# Patient Record
Sex: Female | Born: 1965
Health system: Southern US, Community
[De-identification: ages and names within clinical notes are randomized; demographics above are authoritative.]

## PROBLEM LIST (undated history)

## (undated) DIAGNOSIS — E119 Type 2 diabetes mellitus without complications: Secondary | ICD-10-CM

## (undated) DIAGNOSIS — Z87448 Personal history of other diseases of urinary system: Secondary | ICD-10-CM

## (undated) DIAGNOSIS — D069 Carcinoma in situ of cervix, unspecified: Secondary | ICD-10-CM

## (undated) DIAGNOSIS — Z973 Presence of spectacles and contact lenses: Secondary | ICD-10-CM

## (undated) DIAGNOSIS — I10 Essential (primary) hypertension: Secondary | ICD-10-CM

## (undated) DIAGNOSIS — N63 Unspecified lump in unspecified breast: Secondary | ICD-10-CM

## (undated) DIAGNOSIS — D259 Leiomyoma of uterus, unspecified: Secondary | ICD-10-CM

## (undated) DIAGNOSIS — E782 Mixed hyperlipidemia: Secondary | ICD-10-CM

## (undated) HISTORY — DX: Essential (primary) hypertension: I10

## (undated) HISTORY — DX: Unspecified lump in unspecified breast: N63.0

---
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
## 2008-09-02 ENCOUNTER — Emergency Department (HOSPITAL_BASED_OUTPATIENT_CLINIC_OR_DEPARTMENT_OTHER): Admission: EM | Admit: 2008-09-02 | Discharge: 2008-09-02 | Payer: Self-pay | Admitting: Emergency Medicine

## 2010-09-02 LAB — URINALYSIS, ROUTINE W REFLEX MICROSCOPIC
Glucose, UA: NEGATIVE mg/dL
Protein, ur: NEGATIVE mg/dL
Specific Gravity, Urine: 1.014 (ref 1.005–1.030)
Urobilinogen, UA: 0.2 mg/dL (ref 0.0–1.0)

## 2010-09-02 LAB — WET PREP, GENITAL: Yeast Wet Prep HPF POC: NONE SEEN

## 2010-09-02 LAB — URINE MICROSCOPIC-ADD ON

## 2019-02-07 ENCOUNTER — Other Ambulatory Visit: Payer: Self-pay | Admitting: Obstetrics and Gynecology

## 2019-02-07 DIAGNOSIS — N6320 Unspecified lump in the left breast, unspecified quadrant: Secondary | ICD-10-CM

## 2019-02-14 ENCOUNTER — Ambulatory Visit
Admission: RE | Admit: 2019-02-14 | Discharge: 2019-02-14 | Disposition: A | Discharge status: Home / Self Care | Payer: Commercial Managed Care - PPO | Source: Ambulatory Visit | Attending: Obstetrics and Gynecology | Admitting: Obstetrics and Gynecology

## 2019-02-14 ENCOUNTER — Encounter: Payer: Self-pay | Admitting: Obstetrics and Gynecology

## 2019-02-14 ENCOUNTER — Other Ambulatory Visit: Payer: Self-pay | Admitting: Obstetrics and Gynecology

## 2019-02-14 ENCOUNTER — Other Ambulatory Visit: Payer: Self-pay

## 2019-02-14 DIAGNOSIS — N6320 Unspecified lump in the left breast, unspecified quadrant: Secondary | ICD-10-CM

## 2019-02-14 DIAGNOSIS — R921 Mammographic calcification found on diagnostic imaging of breast: Secondary | ICD-10-CM

## 2019-02-28 ENCOUNTER — Telehealth: Payer: Self-pay | Admitting: *Deleted

## 2019-02-28 NOTE — Telephone Encounter (Signed)
Patient called and scheduled her new patient appt for 10/20. Patient given instructions for no visitors and address

## 2019-03-12 NOTE — Patient Instructions (Addendum)
Plan to have a conization of the cervix at the [facility] on April 02, 2019 with Dr. Driscilla Moats.  You will receive a phone call from the pre-surgical RN to discuss instructions and arrange for your COVID testing.   You will have COVID testing prior to this procedure. Please call for any questions or concerns. Office: [phone]  You can call the [facility] to inquire about the cost of your procedure. Phone: [phone].

## 2019-03-13 ENCOUNTER — Other Ambulatory Visit: Payer: Self-pay

## 2019-03-13 ENCOUNTER — Inpatient Hospital Stay: Payer: Commercial Managed Care - PPO | Attending: Gynecologic Oncology | Admitting: Gynecologic Oncology

## 2019-03-13 ENCOUNTER — Encounter: Payer: Self-pay | Admitting: Gynecologic Oncology

## 2019-03-13 VITALS — BP 140/80 | HR 97 | Temp 98.9°F | Resp 16 | Ht 64.0 in | Wt 147.2 lb

## 2019-03-13 DIAGNOSIS — N879 Dysplasia of cervix uteri, unspecified: Secondary | ICD-10-CM | POA: Insufficient documentation

## 2019-03-13 DIAGNOSIS — A630 Anogenital (venereal) warts: Secondary | ICD-10-CM | POA: Insufficient documentation

## 2019-03-13 DIAGNOSIS — R102 Pelvic and perineal pain: Secondary | ICD-10-CM | POA: Diagnosis not present

## 2019-03-13 DIAGNOSIS — I10 Essential (primary) hypertension: Secondary | ICD-10-CM | POA: Insufficient documentation

## 2019-03-13 DIAGNOSIS — Z7984 Long term (current) use of oral hypoglycemic drugs: Secondary | ICD-10-CM | POA: Insufficient documentation

## 2019-03-13 DIAGNOSIS — E119 Type 2 diabetes mellitus without complications: Secondary | ICD-10-CM | POA: Insufficient documentation

## 2019-03-13 DIAGNOSIS — D069 Carcinoma in situ of cervix, unspecified: Secondary | ICD-10-CM | POA: Insufficient documentation

## 2019-03-13 DIAGNOSIS — D259 Leiomyoma of uterus, unspecified: Secondary | ICD-10-CM | POA: Insufficient documentation

## 2019-03-13 DIAGNOSIS — Z79899 Other long term (current) drug therapy: Secondary | ICD-10-CM | POA: Insufficient documentation

## 2019-03-13 NOTE — Progress Notes (Addendum)
Consult Note: Gyn-Onc  Consult was requested by Dr. Wilhelmenia Blase for the evaluation of Ebony Holland 53 y.o. female  CC:  Chief Complaint  Patient presents with  . Cervical dysplasia    Assessment/Plan:  [name]  is a 53 y.o.  year old with CIN III and symptomatic uterine fibroids.  I discussed with the patient the nature of cervical and lower genital tract dysplasia.  We discussed that is due to HPV infection which is a function of the entire lower genital tract.  For this reason hysterectomy is not curative for cervical dysplasia.  She would require postoperative Paps of the vagina for at least 25 years after hysterectomy and carries an ongoing risk for vaginal or vulvar dysplasia requiring surgery, and potentially development of cancer.  I discussed the phenomenon of occult malignancy which can occur in the setting of a biopsy that showed CIN-2 to 3.  I explained this is due to underrepresentation all of the pathology in the biopsy specimen.  For this reason the gold standard recommendation is to proceed first with an excisional procedure such as a cone or LEEP prior to proceeding with hysterectomy.  I discussed that if malignancy is identified, she would require a radical hysterectomy rather than a extrafascial hysterectomy.  The patient asked why she could not go ahead with a radical hysterectomy regardless.  I discussed that the risks associated with radical hysterectomy particularly bladder dysfunction and fistula are as high as 10 to 30% and therefore the surgery should only be entertained in the setting of histologically confirmed malignancy.  I explained that after a cold knife conization of the cervix we could then consider proceeding with hysterectomy if she desired treatment for her symptomatic uterine fibroids.  I explained that hysterectomy would help with mass-effect symptoms and bleeding but would not likely alleviate her pelvic pain.  Based on my examination I feel that a  great deal of her pelvic pain is due to anxiety and pelvic floor dysfunction.  I do not feel that hysterectomy will resolve this and explained this to the patient.  She is interested in proceeding with hysterectomy.  I did explain to her that an option would be to continue to treat her abnormal uterine bleeding via hormonal methods until she transitioned into menopause at which time she might experience amenorrhea and improvement of symptoms.  She is leaning towards hysterectomy at this time.  We will proceed by first performing cold knife conization of the cervix in November.  After she is healed from the surgery at approximately 6 weeks later we will consider robotic assisted total hysterectomy for uterus greater than 250 g.  I discussed the cold knife conization with the patient, its risks, and anticipated postoperative recovery and symptoms.   HPI: [name] is a 53 year old P4 who was seen in consultation at the request of [place] for evaluation of CIN-3 and symptomatic uterine fibroids.  The patient reported having an abnormal Pap test during her work-up for abnormal uterine bleeding.  This was performed in February 06, 2019 and revealed H SIL with negative high-risk HPV.  Trichomonas was also present.  She reported that 3 years prior to this Pap test she also had an abnormal Pap test that did not require intervention.  Following her H SIL Pap she then proceeded to have a colposcopic evaluation of the cervix on February 15, 2019.  The colposcopic exam was unsatisfactory.  The entire transformation zone was not visualized.  There were acetowhite changes at  11:00.  Biopsies were taken from the ectocervix which revealed benign cervical mucosa and from the endocervix a curettage was performed which showed CIN-2 to 3.  With respect to her fibroid symptoms she reports menorrhagia menorrhagia for approximately 5 to 6 months.  She is never required a blood transfusion.  She reports  increasing pelvic pain and intolerance with pelvic examinations.  Ultrasound dimensions of the uterus on February 15, 2019 revealed a uterus measuring 11.15 x 8.46 x 4.71 cm with an endometrial thickness of 6.8 mm.  The left and right ovaries were all less than 3 cm and normal.  The largest fibroid measured 6.8 cm.  It was subserosal and extending off the fundus.  Otherwise her medical history is remarkable for hypertension and diabetes mellitus.  Her diabetes is type II.  She no longer takes insulin.  She reports that in October 2020 her hemoglobin A1c was 7.1.  She is overweight with a BMI of 25 but no diabetes.  She is a 1 private cesarean section and 3 prior vaginal births but denies other abdominal surgeries and has never had a myomectomy.  Current Meds:  Outpatient Encounter Medications as of 03/13/2019  Medication Sig  . glipiZIDE (GLUCOTROL) 5 MG tablet Take by mouth 2 (two) times daily.  . pioglitazone (ACTOS) 30 MG tablet Take 30 mg by mouth daily.   No facility-administered encounter medications on file as of 03/13/2019.     Allergy: No Known Allergies  Social Hx:   Social History   Socioeconomic History  . Marital status: Legally Separated    Spouse name: Not on file  . Number of children: Not on file  . Years of education: Not on file  . Highest education level: Not on file  Occupational History  . Not on file  Social Needs  . Financial resource strain: Not on file  . Food insecurity    Worry: Not on file    Inability: Not on file  . Transportation needs    Medical: Not on file    Non-medical: Not on file  Tobacco Use  . Smoking status: Former Research scientist (life sciences)  . Smokeless tobacco: Never Used  Substance and Sexual Activity  . Alcohol use: Not Currently  . Drug use: Not on file  . Sexual activity: Not on file  Lifestyle  . Physical activity    Days per week: Not on file    Minutes per session: Not on file  . Stress: Not on file  Relationships  . Social  Herbalist on phone: Not on file    Gets together: Not on file    Attends religious service: Not on file    Active member of club or organization: Not on file    Attends meetings of clubs or organizations: Not on file    Relationship status: Not on file  . Intimate partner violence    Fear of current or ex partner: Not on file    Emotionally abused: Not on file    Physically abused: Not on file    Forced sexual activity: Not on file  Other Topics Concern  . Not on file  Social History Narrative  . Not on file    Past Surgical Hx:  Past Surgical History:  Procedure Laterality Date  . CESAREAN SECTION N/A 05/25/1991  . TUBAL LIGATION Bilateral     Past Medical Hx:  Past Medical History:  Diagnosis Date  . Breast lump    left  . Diabetes  mellitus without complication ([place])   . Hypertension     Past Gynecological History:  No bleeding No LMP recorded.  Family Hx:  Family History  Problem Relation Age of Onset  . Breast cancer Mother     Review of Systems:  Constitutional  Feels well,    ENT Normal appearing ears and nares bilaterally Skin/Breast  No rash, sores, jaundice, itching, dryness Cardiovascular  No chest pain, shortness of breath, or edema  Pulmonary  No cough or wheeze.  Gastro Intestinal  No nausea, vomitting, or diarrhoea. No bright red blood per rectum, no abdominal pain, change in bowel movement, or constipation.  Genito Urinary  No frequency, urgency, dysuria, + abnormal vaginal bleeding, + pelvic pains Musculo Skeletal  No myalgia, arthralgia, joint swelling or pain  Neurologic  No weakness, numbness, change in gait,  Psychology  No depression, anxiety, insomnia.   Vitals:  Blood pressure 140/80, pulse 97, temperature 98.9 F (37.2 C), temperature source Temporal, resp. rate 16, height 5\' 4"  (1.626 m), weight 147 lb 4 oz (66.8 kg), SpO2 95 %.  Physical Exam: WD in NAD Neck  Supple NROM, without any enlargements.  Lymph Node  Survey No cervical supraclavicular or inguinal adenopathy Cardiovascular  Pulse normal rate, regularity and rhythm. S1 and S2 normal.  Lungs  Clear to auscultation bilateraly, without wheezes/crackles/rhonchi. Good air movement.  Skin  No rash/lesions/breakdown  Psychiatry  Alert and oriented to person, place, and time  Abdomen  Normoactive bowel sounds, abdomen soft, non-tender and nonobese without evidence of hernia. Back No CVA tenderness Genito Urinary  Vulva/vagina: Normal external female genitalia.  No lesions. No discharge or bleeding.  Bladder/urethra:  No lesions or masses, well supported bladder  Vagina: normal in appearance, tender at introitus and pelvic floor  Cervix: Normal appearing, no lesions. Palpably normal. 5cm in diameter.  Uterus: Bulky, 14+ cm, mobile, no parametrial involvement or nodularity. Exam limited secondary to patient intolerance to exam.  Adnexa: no palpable masses. Rectal  deferred Extremities  No bilateral cyanosis, clubbing or edema.   Thereasa Solo, MD  03/13/2019, 1:04 PM

## 2019-03-13 NOTE — H&P (View-Only) (Signed)
Consult Note: Gyn-Onc  Consult was requested by Dr. Wilhelmenia Blase for the evaluation of Ebony Holland 53 y.o. female  CC:  Chief Complaint  Patient presents with  . Cervical dysplasia    Assessment/Plan:  Ebony Holland  is a 53 y.o.  year old with CIN III and symptomatic uterine fibroids.  I discussed with the patient the nature of cervical and lower genital tract dysplasia.  We discussed that is due to HPV infection which is a function of the entire lower genital tract.  For this reason hysterectomy is not curative for cervical dysplasia.  She would require postoperative Paps of the vagina for at least 25 years after hysterectomy and carries an ongoing risk for vaginal or vulvar dysplasia requiring surgery, and potentially development of cancer.  I discussed the phenomenon of occult malignancy which can occur in the setting of a biopsy that showed CIN-2 to 3.  I explained this is due to underrepresentation all of the pathology in the biopsy specimen.  For this reason the gold standard recommendation is to proceed first with an excisional procedure such as a cone or LEEP prior to proceeding with hysterectomy.  I discussed that if malignancy is identified, she would require a radical hysterectomy rather than a extrafascial hysterectomy.  The patient asked why she could not go ahead with a radical hysterectomy regardless.  I discussed that the risks associated with radical hysterectomy particularly bladder dysfunction and fistula are as high as 10 to 30% and therefore the surgery should only be entertained in the setting of histologically confirmed malignancy.  I explained that after a cold knife conization of the cervix we could then consider proceeding with hysterectomy if she desired treatment for her symptomatic uterine fibroids.  I explained that hysterectomy would help with mass-effect symptoms and bleeding but would not likely alleviate her pelvic pain.  Based on my examination I feel that a  great deal of her pelvic pain is due to anxiety and pelvic floor dysfunction.  I do not feel that hysterectomy will resolve this and explained this to the patient.  She is interested in proceeding with hysterectomy.  I did explain to her that an option would be to continue to treat her abnormal uterine bleeding via hormonal methods until she transitioned into menopause at which time she might experience amenorrhea and improvement of symptoms.  She is leaning towards hysterectomy at this time.  We will proceed by first performing cold knife conization of the cervix in November.  After she is healed from the surgery at approximately 6 weeks later we will consider robotic assisted total hysterectomy for uterus greater than 250 g.  I discussed the cold knife conization with the patient, its risks, and anticipated postoperative recovery and symptoms.   HPI: Ebony Holland is a 53 year old P4 who was seen in consultation at the request of [place] for evaluation of CIN-3 and symptomatic uterine fibroids.  The patient reported having an abnormal Pap test during her work-up for abnormal uterine bleeding.  This was performed in February 06, 2019 and revealed H SIL with negative high-risk HPV.  Trichomonas was also present.  She reported that 3 years prior to this Pap test she also had an abnormal Pap test that did not require intervention.  Following her H SIL Pap she then proceeded to have a colposcopic evaluation of the cervix on February 15, 2019.  The colposcopic exam was unsatisfactory.  The entire transformation zone was not visualized.  There were acetowhite changes at  11:00.  Biopsies were taken from the ectocervix which revealed benign cervical mucosa and from the endocervix a curettage was performed which showed CIN-2 to 3.  With respect to her fibroid symptoms she reports menorrhagia menorrhagia for approximately 5 to 6 months.  She is never required a blood transfusion.  She reports  increasing pelvic pain and intolerance with pelvic examinations.  Ultrasound dimensions of the uterus on February 15, 2019 revealed a uterus measuring 11.15 x 8.46 x 4.71 cm with an endometrial thickness of 6.8 mm.  The left and right ovaries were all less than 3 cm and normal.  The largest fibroid measured 6.8 cm.  It was subserosal and extending off the fundus.  Otherwise her medical history is remarkable for hypertension and diabetes mellitus.  Her diabetes is type II.  She no longer takes insulin.  She reports that in October 2020 her hemoglobin A1c was 7.1.  She is overweight with a BMI of 25 but no diabetes.  She is a 1 private cesarean section and 3 prior vaginal births but denies other abdominal surgeries and has never had a myomectomy.  Current Meds:  Outpatient Encounter Medications as of 03/13/2019  Medication Sig  . glipiZIDE (GLUCOTROL) 5 MG tablet Take by mouth 2 (two) times daily.  . pioglitazone (ACTOS) 30 MG tablet Take 30 mg by mouth daily.   No facility-administered encounter medications on file as of 03/13/2019.     Allergy: No Known Allergies  Social Hx:   Social History   Socioeconomic History  . Marital status: Legally Separated    Spouse name: Not on file  . Number of children: Not on file  . Years of education: Not on file  . Highest education level: Not on file  Occupational History  . Not on file  Social Needs  . Financial resource strain: Not on file  . Food insecurity    Worry: Not on file    Inability: Not on file  . Transportation needs    Medical: Not on file    Non-medical: Not on file  Tobacco Use  . Smoking status: Former Research scientist (life sciences)  . Smokeless tobacco: Never Used  Substance and Sexual Activity  . Alcohol use: Not Currently  . Drug use: Not on file  . Sexual activity: Not on file  Lifestyle  . Physical activity    Days per week: Not on file    Minutes per session: Not on file  . Stress: Not on file  Relationships  . Social  Herbalist on phone: Not on file    Gets together: Not on file    Attends religious service: Not on file    Active member of club or organization: Not on file    Attends meetings of clubs or organizations: Not on file    Relationship status: Not on file  . Intimate partner violence    Fear of current or ex partner: Not on file    Emotionally abused: Not on file    Physically abused: Not on file    Forced sexual activity: Not on file  Other Topics Concern  . Not on file  Social History Narrative  . Not on file    Past Surgical Hx:  Past Surgical History:  Procedure Laterality Date  . CESAREAN SECTION N/A 05/25/1991  . TUBAL LIGATION Bilateral     Past Medical Hx:  Past Medical History:  Diagnosis Date  . Breast lump    left  . Diabetes  mellitus without complication ([place])   . Hypertension     Past Gynecological History:  No bleeding No LMP recorded.  Family Hx:  Family History  Problem Relation Age of Onset  . Breast cancer Mother     Review of Systems:  Constitutional  Feels well,    ENT Normal appearing ears and nares bilaterally Skin/Breast  No rash, sores, jaundice, itching, dryness Cardiovascular  No chest pain, shortness of breath, or edema  Pulmonary  No cough or wheeze.  Gastro Intestinal  No nausea, vomitting, or diarrhoea. No bright red blood per rectum, no abdominal pain, change in bowel movement, or constipation.  Genito Urinary  No frequency, urgency, dysuria, + abnormal vaginal bleeding, + pelvic pains Musculo Skeletal  No myalgia, arthralgia, joint swelling or pain  Neurologic  No weakness, numbness, change in gait,  Psychology  No depression, anxiety, insomnia.   Vitals:  Blood pressure 140/80, pulse 97, temperature 98.9 F (37.2 C), temperature source Temporal, resp. rate 16, height 5\' 4"  (1.626 m), weight 147 lb 4 oz (66.8 kg), SpO2 95 %.  Physical Exam: WD in NAD Neck  Supple NROM, without any enlargements.  Lymph Node  Survey No cervical supraclavicular or inguinal adenopathy Cardiovascular  Pulse normal rate, regularity and rhythm. S1 and S2 normal.  Lungs  Clear to auscultation bilateraly, without wheezes/crackles/rhonchi. Good air movement.  Skin  No rash/lesions/breakdown  Psychiatry  Alert and oriented to person, place, and time  Abdomen  Normoactive bowel sounds, abdomen soft, non-tender and nonobese without evidence of hernia. Back No CVA tenderness Genito Urinary  Vulva/vagina: Normal external female genitalia.  No lesions. No discharge or bleeding.  Bladder/urethra:  No lesions or masses, well supported bladder  Vagina: normal in appearance, tender at introitus and pelvic floor  Cervix: Normal appearing, no lesions. Palpably normal. 5cm in diameter.  Uterus: Bulky, 14+ cm, mobile, no parametrial involvement or nodularity. Exam limited secondary to patient intolerance to exam.  Adnexa: no palpable masses. Rectal  deferred Extremities  No bilateral cyanosis, clubbing or edema.   Thereasa Solo, MD  03/13/2019, 1:04 PM

## 2019-03-23 ENCOUNTER — Encounter (HOSPITAL_COMMUNITY): Payer: Self-pay | Admitting: *Deleted

## 2019-03-23 ENCOUNTER — Other Ambulatory Visit: Payer: Self-pay

## 2019-03-23 ENCOUNTER — Emergency Department (HOSPITAL_COMMUNITY): Payer: Commercial Managed Care - PPO

## 2019-03-23 ENCOUNTER — Inpatient Hospital Stay (HOSPITAL_COMMUNITY)
Admission: EM | Admit: 2019-03-23 | Discharge: 2019-03-25 | DRG: 690 | Disposition: A | Discharge status: Home / Self Care | Payer: Commercial Managed Care - PPO | Attending: Internal Medicine | Admitting: Internal Medicine

## 2019-03-23 ENCOUNTER — Telehealth: Payer: Self-pay | Admitting: *Deleted

## 2019-03-23 DIAGNOSIS — B9620 Unspecified Escherichia coli [E. coli] as the cause of diseases classified elsewhere: Secondary | ICD-10-CM | POA: Diagnosis present

## 2019-03-23 DIAGNOSIS — N10 Acute tubulo-interstitial nephritis: Principal | ICD-10-CM | POA: Diagnosis present

## 2019-03-23 DIAGNOSIS — E1169 Type 2 diabetes mellitus with other specified complication: Secondary | ICD-10-CM

## 2019-03-23 DIAGNOSIS — E119 Type 2 diabetes mellitus without complications: Secondary | ICD-10-CM | POA: Diagnosis present

## 2019-03-23 DIAGNOSIS — I10 Essential (primary) hypertension: Secondary | ICD-10-CM | POA: Diagnosis not present

## 2019-03-23 DIAGNOSIS — E871 Hypo-osmolality and hyponatremia: Secondary | ICD-10-CM

## 2019-03-23 DIAGNOSIS — Z20828 Contact with and (suspected) exposure to other viral communicable diseases: Secondary | ICD-10-CM | POA: Diagnosis present

## 2019-03-23 DIAGNOSIS — D509 Iron deficiency anemia, unspecified: Secondary | ICD-10-CM

## 2019-03-23 DIAGNOSIS — Z803 Family history of malignant neoplasm of breast: Secondary | ICD-10-CM

## 2019-03-23 DIAGNOSIS — E860 Dehydration: Secondary | ICD-10-CM | POA: Diagnosis present

## 2019-03-23 DIAGNOSIS — N938 Other specified abnormal uterine and vaginal bleeding: Secondary | ICD-10-CM | POA: Diagnosis present

## 2019-03-23 DIAGNOSIS — D500 Iron deficiency anemia secondary to blood loss (chronic): Secondary | ICD-10-CM | POA: Diagnosis present

## 2019-03-23 DIAGNOSIS — D259 Leiomyoma of uterus, unspecified: Secondary | ICD-10-CM

## 2019-03-23 DIAGNOSIS — Z79899 Other long term (current) drug therapy: Secondary | ICD-10-CM

## 2019-03-23 DIAGNOSIS — Z7984 Long term (current) use of oral hypoglycemic drugs: Secondary | ICD-10-CM

## 2019-03-23 DIAGNOSIS — N12 Tubulo-interstitial nephritis, not specified as acute or chronic: Secondary | ICD-10-CM | POA: Diagnosis not present

## 2019-03-23 DIAGNOSIS — Z87891 Personal history of nicotine dependence: Secondary | ICD-10-CM

## 2019-03-23 DIAGNOSIS — E782 Mixed hyperlipidemia: Secondary | ICD-10-CM | POA: Diagnosis present

## 2019-03-23 DIAGNOSIS — E878 Other disorders of electrolyte and fluid balance, not elsewhere classified: Secondary | ICD-10-CM | POA: Diagnosis present

## 2019-03-23 DIAGNOSIS — Z86001 Personal history of in-situ neoplasm of cervix uteri: Secondary | ICD-10-CM

## 2019-03-23 LAB — CBC
HCT: 40.1 % (ref 36.0–46.0)
Hemoglobin: 12.4 g/dL (ref 12.0–15.0)
MCH: 22.7 pg — ABNORMAL LOW (ref 26.0–34.0)
MCHC: 30.9 g/dL (ref 30.0–36.0)
MCV: 73.4 fL — ABNORMAL LOW (ref 80.0–100.0)
Platelets: 248 10*3/uL (ref 150–400)
RBC: 5.46 MIL/uL — ABNORMAL HIGH (ref 3.87–5.11)
RDW: 20.5 % — ABNORMAL HIGH (ref 11.5–15.5)
WBC: 13.9 10*3/uL — ABNORMAL HIGH (ref 4.0–10.5)
nRBC: 0 % (ref 0.0–0.2)

## 2019-03-23 LAB — COMPREHENSIVE METABOLIC PANEL
ALT: 14 U/L (ref 0–44)
AST: 23 U/L (ref 15–41)
Albumin: 4.6 g/dL (ref 3.5–5.0)
Alkaline Phosphatase: 60 U/L (ref 38–126)
Anion gap: 14 (ref 5–15)
BUN: 13 mg/dL (ref 6–20)
CO2: 23 mmol/L (ref 22–32)
Calcium: 9.9 mg/dL (ref 8.9–10.3)
Chloride: 96 mmol/L — ABNORMAL LOW (ref 98–111)
Creatinine, Ser: 0.91 mg/dL (ref 0.44–1.00)
GFR calc Af Amer: >60 mL/min (ref >60)
GFR calc non Af Amer: >60 mL/min (ref >60)
Glucose, Bld: 246 mg/dL — ABNORMAL HIGH (ref 70–99)
Potassium: 4.1 mmol/L (ref 3.5–5.1)
Sodium: 133 mmol/L — ABNORMAL LOW (ref 135–145)
Total Bilirubin: 1.1 mg/dL (ref 0.3–1.2)
Total Protein: 9.7 g/dL — ABNORMAL HIGH (ref 6.5–8.1)

## 2019-03-23 LAB — URINALYSIS, ROUTINE W REFLEX MICROSCOPIC
Bilirubin Urine: NEGATIVE
Glucose, UA: >=500 mg/dL — AB
Ketones, ur: 80 mg/dL — AB
Nitrite: NEGATIVE
Protein, ur: >=300 mg/dL — AB
Specific Gravity, Urine: 1.013 (ref 1.005–1.030)
WBC, UA: >50 WBC/hpf — ABNORMAL HIGH (ref 0–5)
pH: 5 (ref 5.0–8.0)

## 2019-03-23 LAB — POC URINE PREG, ED: Preg Test, Ur: NEGATIVE

## 2019-03-23 LAB — CBG MONITORING, ED: Glucose-Capillary: 203 mg/dL — ABNORMAL HIGH (ref 70–99)

## 2019-03-23 LAB — LIPASE, BLOOD: Lipase: 24 U/L (ref 11–51)

## 2019-03-23 LAB — HCG, QUANTITATIVE, PREGNANCY: hCG, Beta Chain, Quant, S: <1 m[IU]/mL (ref <5)

## 2019-03-23 MED ORDER — TRAMADOL HCL 50 MG PO TABS
50.0000 mg | ORAL_TABLET | Freq: Four times a day (QID) | ORAL | Status: DC | PRN
  Administered 2019-03-24: 50 mg via ORAL
  Filled 2019-03-23: qty 1

## 2019-03-23 MED ORDER — INSULIN ASPART 100 UNIT/ML ~~LOC~~ SOLN
0.0000 [IU] | Freq: Three times a day (TID) | SUBCUTANEOUS | Status: DC
  Administered 2019-03-24 (×2): 3 [IU] via SUBCUTANEOUS
  Administered 2019-03-25: 2 [IU] via SUBCUTANEOUS
  Filled 2019-03-23: qty 0.09

## 2019-03-23 MED ORDER — ACETAMINOPHEN 325 MG PO TABS: 650.0000 mg | ORAL_TABLET | Freq: Four times a day (QID) | ORAL | Status: DC | PRN

## 2019-03-23 MED ORDER — LISINOPRIL 20 MG PO TABS
40.0000 mg | ORAL_TABLET | Freq: Every day | ORAL | Status: DC
  Administered 2019-03-24 – 2019-03-25 (×2): 40 mg via ORAL
  Filled 2019-03-23 (×2): qty 2

## 2019-03-23 MED ORDER — IOHEXOL 300 MG/ML  SOLN
100.0000 mL | Freq: Once | INTRAMUSCULAR | Status: AC | PRN
  Administered 2019-03-23: 100 mL via INTRAVENOUS

## 2019-03-23 MED ORDER — SODIUM CHLORIDE 0.9% FLUSH
3.0000 mL | Freq: Once | INTRAVENOUS | Status: AC
  Administered 2019-03-23: 3 mL via INTRAVENOUS

## 2019-03-23 MED ORDER — KETOROLAC TROMETHAMINE 30 MG/ML IJ SOLN: 30.0000 mg | Freq: Four times a day (QID) | INTRAMUSCULAR | Status: DC | PRN

## 2019-03-23 MED ORDER — SODIUM CHLORIDE 0.9 % IV SOLN
1.0000 g | Freq: Once | INTRAVENOUS | Status: AC
  Administered 2019-03-23: 1 g via INTRAVENOUS
  Filled 2019-03-23: qty 10

## 2019-03-23 MED ORDER — SODIUM CHLORIDE 0.9 % IV BOLUS
500.0000 mL | Freq: Once | INTRAVENOUS | Status: AC
  Administered 2019-03-23: 500 mL via INTRAVENOUS

## 2019-03-23 MED ORDER — ONDANSETRON HCL 4 MG PO TABS: 4.0000 mg | ORAL_TABLET | Freq: Four times a day (QID) | ORAL | Status: DC | PRN

## 2019-03-23 MED ORDER — SODIUM CHLORIDE 0.9 % IV SOLN
1.0000 g | INTRAVENOUS | Status: DC
  Administered 2019-03-24: 1 g via INTRAVENOUS
  Filled 2019-03-23: qty 1
  Filled 2019-03-23: qty 10

## 2019-03-23 MED ORDER — ACETAMINOPHEN 650 MG RE SUPP: 650.0000 mg | Freq: Four times a day (QID) | RECTAL | Status: DC | PRN

## 2019-03-23 MED ORDER — SODIUM CHLORIDE (PF) 0.9 % IJ SOLN
INTRAMUSCULAR | Status: AC
  Filled 2019-03-23: qty 50

## 2019-03-23 MED ORDER — SODIUM CHLORIDE 0.9 % IV SOLN
INTRAVENOUS | Status: DC
  Administered 2019-03-23 – 2019-03-25 (×4): via INTRAVENOUS

## 2019-03-23 MED ORDER — HYDRALAZINE HCL 20 MG/ML IJ SOLN: 5.0000 mg | INTRAMUSCULAR | Status: DC | PRN

## 2019-03-23 MED ORDER — ONDANSETRON HCL 4 MG/2ML IJ SOLN: 4.0000 mg | Freq: Four times a day (QID) | INTRAMUSCULAR | Status: DC | PRN

## 2019-03-23 MED ORDER — INSULIN ASPART 100 UNIT/ML ~~LOC~~ SOLN
3.0000 [IU] | Freq: Once | SUBCUTANEOUS | Status: AC
  Administered 2019-03-23: 3 [IU] via SUBCUTANEOUS
  Filled 2019-03-23: qty 0.03

## 2019-03-23 MED ORDER — FENTANYL CITRATE (PF) 100 MCG/2ML IJ SOLN
50.0000 ug | Freq: Once | INTRAMUSCULAR | Status: AC
  Administered 2019-03-23: 50 ug via INTRAVENOUS
  Filled 2019-03-23: qty 2

## 2019-03-23 MED ORDER — ONDANSETRON HCL 4 MG/2ML IJ SOLN
4.0000 mg | Freq: Once | INTRAMUSCULAR | Status: AC
  Administered 2019-03-23: 4 mg via INTRAVENOUS
  Filled 2019-03-23: qty 2

## 2019-03-23 NOTE — ED Provider Notes (Signed)
[facility] DEPT Provider Note   CSN: [id number] Arrival date & time: 03/23/19  1455     History   Chief Complaint Chief Complaint  Patient presents with   Abdominal Pain   Emesis    HPI Ebony Holland is a 53 y.o. female.     Patient is a 53 year old female who presents with abdominal pain.  She states she has a history of uterine fibroids and recently had an abnormal Pap smear.  She was diagnosed with CIN-3.  She is scheduled to have surgery on November 9 to remove the fibroids and some kind of surgery regarding the abnormal Pap smear.  She does not typically have pain with her fibroids other than some cramping and heavy menstrual cycles.  She states over the last week she has had worsening pain throughout her abdomen.  Its worse on the left side and towards the lower abdomen but its radiates all through her abdomen.  She had associated nausea and vomiting.  No fevers.  No change in bowels.  No urinary symptoms.  No history of prior abdominal surgeries.     Past Medical History:  Diagnosis Date   Breast lump    left   Diabetes mellitus without complication ([place])    Hypertension     Patient Active Problem List   Diagnosis Date Noted   Pyelonephritis 03/23/2019   CIN III (cervical intraepithelial neoplasia III) 03/13/2019    Past Surgical History:  Procedure Laterality Date   CESAREAN SECTION N/A 05/25/1991   TUBAL LIGATION Bilateral      OB History   No obstetric history on file.      Home Medications    Prior to Admission medications   Medication Sig Start Date End Date Taking? Authorizing Provider  cholecalciferol (VITAMIN D3) 25 MCG (1000 UT) tablet Take 1,000 Units by mouth daily.   Yes [provider]  glipiZIDE (GLUCOTROL) 5 MG tablet Take by mouth 2 (two) times daily.   Yes [provider]  ibuprofen (ADVIL) 600 MG tablet Take 600 mg by mouth every 6 (six) hours as needed.   Yes [provider]  lisinopril (ZESTRIL) 40 MG tablet Take 40 mg by mouth daily. 12/24/15  Yes [provider]  Multiple Vitamin (MULTIVITAMIN) capsule Take 1 capsule by mouth daily.   Yes [provider]  pioglitazone (ACTOS) 30 MG tablet Take 30 mg by mouth daily.   Yes [provider]    Family History Family History  Problem Relation Age of Onset   Breast cancer Mother     Social History Social History   Tobacco Use   Smoking status: Former Smoker   Smokeless tobacco: Never Used  Substance Use Topics   Alcohol use: Not Currently   Drug use: Not on file     Allergies   Patient has no known allergies.   Review of Systems Review of Systems  Constitutional: Negative for chills, diaphoresis, fatigue and fever.  HENT: Negative for congestion, rhinorrhea and sneezing.   Eyes: Negative.   Respiratory: Negative for cough, chest tightness and shortness of breath.   Cardiovascular: Negative for chest pain and leg swelling.  Gastrointestinal: Positive for abdominal pain, nausea and vomiting. Negative for blood in stool and diarrhea.  Genitourinary: Negative for difficulty urinating, flank pain, frequency and hematuria.  Musculoskeletal: Negative for arthralgias and back pain.  Skin: Negative for rash.  Neurological: Negative for dizziness, speech difficulty, weakness, numbness and headaches.     Physical  Exam Updated Vital Signs BP 135/76    Pulse (!) 101    Temp 98.7 F (37.1 C) (Oral)    Resp 16    LMP 03/06/2019    SpO2 100%   Physical Exam Constitutional:      Appearance: She is well-developed.  HENT:     Head: Normocephalic and atraumatic.  Eyes:     Pupils: Pupils are equal, round, and reactive to light.  Neck:     Musculoskeletal: Normal range of motion and neck supple.  Cardiovascular:     Rate and Rhythm: Normal rate and regular rhythm.     Heart sounds: Normal heart sounds.  Pulmonary:     Effort: Pulmonary effort is normal. No  respiratory distress.     Breath sounds: Normal breath sounds. No wheezing or rales.  Chest:     Chest wall: No tenderness.  Abdominal:     General: Bowel sounds are normal.     Palpations: Abdomen is soft.     Tenderness: There is generalized abdominal tenderness. There is no guarding or rebound.  Musculoskeletal: Normal range of motion.  Lymphadenopathy:     Cervical: No cervical adenopathy.  Skin:    General: Skin is warm and dry.     Findings: No rash.  Neurological:     Mental Status: She is alert and oriented to person, place, and time.      ED Treatments / Results  Labs (all labs ordered are listed, but only abnormal results are displayed) Labs Reviewed  COMPREHENSIVE METABOLIC PANEL - Abnormal; Notable for the following components:      Result Value   Sodium 133 (*)    Chloride 96 (*)    Glucose, Bld 246 (*)    Total Protein 9.7 (*)    All other components within normal limits  CBC - Abnormal; Notable for the following components:   WBC 13.9 (*)    RBC 5.46 (*)    MCV 73.4 (*)    MCH 22.7 (*)    RDW 20.5 (*)    All other components within normal limits  URINALYSIS, ROUTINE W REFLEX MICROSCOPIC - Abnormal; Notable for the following components:   APPearance HAZY (*)    Glucose, UA >=500 (*)    Hgb urine dipstick SMALL (*)    Ketones, ur 80 (*)    Protein, ur >=300 (*)    Leukocytes,Ua SMALL (*)    WBC, UA >50 (*)    Bacteria, UA RARE (*)    All other components within normal limits  URINE CULTURE  SARS CORONAVIRUS 2 (TAT 6-24 HRS)  LIPASE, BLOOD  HCG, QUANTITATIVE, PREGNANCY  POC URINE PREG, ED    EKG None  Radiology Ct Abdomen Pelvis W Contrast  Result Date: 03/23/2019 CLINICAL DATA:  Abdominal pain for a week. EXAM: CT ABDOMEN AND PELVIS WITH CONTRAST TECHNIQUE: Multidetector CT imaging of the abdomen and pelvis was performed using the standard protocol following bolus administration of intravenous contrast. CONTRAST:  141mL OMNIPAQUE IOHEXOL 300  MG/ML  SOLN COMPARISON:  None. FINDINGS: Lower chest: No acute abnormality. Hepatobiliary: No focal liver abnormality is seen. No gallstones, gallbladder wall thickening, or biliary dilatation. Pancreas: Unremarkable. No pancreatic ductal dilatation or surrounding inflammatory changes. Spleen: Low-attenuation regions in the spleen are nonspecific but may represent cysts or hemangiomas. Adrenals/Urinary Tract: Adrenal glands are normal. There is urothelial enhancement in the left renal pelvis and proximal left ureter. There is a small amount of adjacent fat stranding and a small amount  of perinephric fluid in this region. The remainder of the left ureter is normal with no stones. No evidence of pyelonephritis. The kidneys are otherwise normal. The right ureter is normal. The bladder is unremarkable. Adrenal glands are normal. Stomach/Bowel: The stomach and small bowel are unremarkable. The colon and appendix are normal. Vascular/Lymphatic: No significant vascular findings are present. No enlarged abdominal or pelvic lymph nodes. Reproductive: There is a large fibroid in the uterus. Other: No abdominal wall hernia or abnormality. No abdominopelvic ascites. Musculoskeletal: There is a sclerotic lesion within L2 which is ill-defined and nonspecific. A sclerotic focus is seen at L1 as well. IMPRESSION: 1. There is urothelial enhancement in the left renal pelvis and proximal ureter with a small amount of adjacent fat stranding and perinephric fluid. These findings are likely infectious. No associated parenchymal changes are seen in the left kidney. Recommend correlation with history, physical exam, and urinalysis. 2. Sclerotic lesion in L2. Sclerotic focus at L1. These findings are nonspecific. If the patient has outside imaging, recommend comparison. Otherwise, recommend a bone scan for further evaluation. 3. Large fibroid in the uterus. 4. Low-attenuation lesions in the spleen are nonspecific but of doubtful acute  significance. Electronically Signed   By: Dorise Bullion III M.D   On: 03/23/2019 20:42    Procedures Procedures (including critical care time)  Medications Ordered in ED Medications  sodium chloride (PF) 0.9 % injection (has no administration in time range)  cefTRIAXone (ROCEPHIN) 1 g in sodium chloride 0.9 % 100 mL IVPB (has no administration in time range)  sodium chloride 0.9 % bolus 500 mL (has no administration in time range)  sodium chloride flush (NS) 0.9 % injection 3 mL (3 mLs Intravenous Given 03/23/19 1837)  fentaNYL (SUBLIMAZE) injection 50 mcg (50 mcg Intravenous Given 03/23/19 1823)  sodium chloride 0.9 % bolus 500 mL (0 mLs Intravenous Stopped 03/23/19 1923)  ondansetron (ZOFRAN) injection 4 mg (4 mg Intravenous Given 03/23/19 1822)  iohexol (OMNIPAQUE) 300 MG/ML solution 100 mL (100 mLs Intravenous Contrast Given 03/23/19 2006)     Initial Impression / Assessment and Plan / ED Course  I have reviewed the triage vital signs and the nursing notes.  Pertinent labs & imaging results that were available during my care of the patient were reviewed by me and considered in my medical decision making (see chart for details).        Patient is a 53 year old female who presents with left-sided abdominal pain with some decreased urination and nausea and vomiting.  Her CT scan shows evidence of probable pyelonephritis.  Her urine has some signs of infection.  Her labs show an elevated white count.  She also has an elevated glucose.  There is no suggestions of DKA.  She was given IV fluids and IV antibiotics..  She is mildly tachycardic with a heart rate in the low 100s.  Her blood pressure stable.  I spoke with Dr. Barbaraann Faster who will admit the patient for further treatment.  Final Clinical Impressions(s) / ED Diagnoses   Final diagnoses:  Pyelonephritis    ED Discharge Orders    None       Malvin Johns, MD 03/23/19 2135

## 2019-03-23 NOTE — ED Notes (Signed)
Pt ambulatory to and from bathroom with a steady gait. No assistance needed.  

## 2019-03-23 NOTE — Telephone Encounter (Signed)
Patient's daughter called and stated "my mom is at a level 10 pain level, she wants to know if we should go to the ER and switch one." Per Melissa APP patient to go the [facility] ER, daughter verbalized understanding

## 2019-03-23 NOTE — ED Notes (Signed)
ED TO INPATIENT HANDOFF REPORT  Name/Age/Gender Ebony Holland 53 y.o. female  Code Status    Code Status Orders  (From admission, onward)         Start     Ordered   03/23/19 2157  Full code  Continuous     03/23/19 2201        Code Status History    This patient has a current code status but no historical code status.   Advance Care Planning Activity      Home/SNF/Other Home  Chief Complaint abd pain; emesis  Level of Care/Admitting Diagnosis ED Disposition    ED Disposition Condition Comment   Admit  Hospital Area: [facility] [id number]  Level of Care: Med-Surg [16]  Covid Evaluation: Asymptomatic Screening Protocol (No Symptoms)  Diagnosis: Pyelonephritis [id number]  Admitting Physician: Waldemar Dickens [id number]  Attending Physician: MERRELL, DAVID J [id number]  PT Class (Do Not Modify): Observation [104]  PT Acc Code (Do Not Modify): Observation [10022]       Medical History Past Medical History:  Diagnosis Date  . Breast lump    left  . Diabetes mellitus without complication ([place])   . Hypertension     Allergies No Known Allergies  IV Location/Drains/Wounds Patient Lines/Drains/Airways Status   Active Line/Drains/Airways    Name:   Placement date:   Placement time:   Site:   Days:   Peripheral IV 03/23/19 Left Antecubital   03/23/19    1821    Antecubital   less than 1          Labs/Imaging Results for orders placed or performed during the hospital encounter of 03/23/19 (from the past 48 hour(s))  Lipase, blood     Status: None   Collection Time: 03/23/19  5:45 PM  Result Value Ref Range   Lipase 24 11 - 51 U/L    Comment: Performed at [facility], [place] [address] [place], [place] [address]  Comprehensive metabolic panel     Status: Abnormal   Collection Time: 03/23/19  5:45 PM  Result Value Ref Range   Sodium 133 (L) 135 - 145 mmol/L   Potassium 4.1 3.5 - 5.1 mmol/L   Chloride 96 (L) 98 - 111 mmol/L    CO2 23 22 - 32 mmol/L   Glucose, Bld 246 (H) 70 - 99 mg/dL   BUN 13 6 - 20 mg/dL   Creatinine, Ser 0.91 0.44 - 1.00 mg/dL   Calcium 9.9 8.9 - 10.3 mg/dL   Total Protein 9.7 (H) 6.5 - 8.1 g/dL   Albumin 4.6 3.5 - 5.0 g/dL   AST 23 15 - 41 U/L   ALT 14 0 - 44 U/L   Alkaline Phosphatase 60 38 - 126 U/L   Total Bilirubin 1.1 0.3 - 1.2 mg/dL   GFR calc non Af Amer >60 >60 mL/min   GFR calc Af Amer >60 >60 mL/min   Anion gap 14 5 - 15    Comment: Performed at [facility], [place] [address] [place], [place] [address]  CBC     Status: Abnormal   Collection Time: 03/23/19  5:45 PM  Result Value Ref Range   WBC 13.9 (H) 4.0 - 10.5 K/uL   RBC 5.46 (H) 3.87 - 5.11 MIL/uL   Hemoglobin 12.4 12.0 - 15.0 g/dL   HCT 40.1 36.0 - 46.0 %   MCV 73.4 (L) 80.0 - 100.0 fL   MCH 22.7 (L) 26.0 - 34.0 pg  MCHC 30.9 30.0 - 36.0 g/dL   RDW 20.5 (H) 11.5 - 15.5 %   Platelets 248 150 - 400 K/uL   nRBC 0.0 0.0 - 0.2 %    Comment: Performed at [facility], [place] [address], [place], [place] [address]  hCG, quantitative, pregnancy     Status: None   Collection Time: 03/23/19  5:45 PM  Result Value Ref Range   hCG, Beta Chain, Quant, S <1 <5 mIU/mL    Comment:          GEST. AGE      CONC.  (mIU/mL)   <=1 WEEK        5 - 50     2 WEEKS       50 - 500     3 WEEKS       100 - 10,000     4 WEEKS     1,000 - 30,000     5 WEEKS     3,500 - 115,000   6-8 WEEKS     12,000 - 270,000    12 WEEKS     15,000 - 220,000        FEMALE AND NON-PREGNANT FEMALE:     LESS THAN 5 mIU/mL Performed at [facility], [place] [address], [place], [place] [address]   Urinalysis, Routine w reflex microscopic     Status: Abnormal   Collection Time: 03/23/19  7:15 PM  Result Value Ref Range   Color, Urine YELLOW YELLOW   APPearance HAZY (A) CLEAR   Specific Gravity, Urine 1.013 1.005 - 1.030   pH 5.0 5.0 - 8.0   Glucose, UA >=500 (A) NEGATIVE mg/dL   Hgb urine dipstick  SMALL (A) NEGATIVE   Bilirubin Urine NEGATIVE NEGATIVE   Ketones, ur 80 (A) NEGATIVE mg/dL   Protein, ur >=300 (A) NEGATIVE mg/dL   Nitrite NEGATIVE NEGATIVE   Leukocytes,Ua SMALL (A) NEGATIVE   RBC / HPF 0-5 0 - 5 RBC/hpf   WBC, UA >50 (H) 0 - 5 WBC/hpf   Bacteria, UA RARE (A) NONE SEEN   Squamous Epithelial / LPF 0-5 0 - 5   Mucus PRESENT     Comment: Performed at [facility], [place] [address], [place], [place] [address]  POC Urine Pregnancy, ED (not at [facility])     Status: None   Collection Time: 03/23/19  7:31 PM  Result Value Ref Range   Preg Test, Ur NEGATIVE NEGATIVE    Comment:        THE SENSITIVITY OF THIS METHODOLOGY IS >24 mIU/mL   CBG monitoring, ED     Status: Abnormal   Collection Time: 03/23/19 10:29 PM  Result Value Ref Range   Glucose-Capillary 203 (H) 70 - 99 mg/dL   Ct Abdomen Pelvis W Contrast  Result Date: 03/23/2019 CLINICAL DATA:  Abdominal pain for a week. EXAM: CT ABDOMEN AND PELVIS WITH CONTRAST TECHNIQUE: Multidetector CT imaging of the abdomen and pelvis was performed using the standard protocol following bolus administration of intravenous contrast. CONTRAST:  193mL OMNIPAQUE IOHEXOL 300 MG/ML  SOLN COMPARISON:  None. FINDINGS: Lower chest: No acute abnormality. Hepatobiliary: No focal liver abnormality is seen. No gallstones, gallbladder wall thickening, or biliary dilatation. Pancreas: Unremarkable. No pancreatic ductal dilatation or surrounding inflammatory changes. Spleen: Low-attenuation regions in the spleen are nonspecific but may represent cysts or hemangiomas. Adrenals/Urinary Tract: Adrenal glands are normal. There is urothelial enhancement in the left renal pelvis and proximal left  ureter. There is a small amount of adjacent fat stranding and a small amount of perinephric fluid in this region. The remainder of the left ureter is normal with no stones. No evidence of pyelonephritis. The kidneys are otherwise normal. The right ureter is  normal. The bladder is unremarkable. Adrenal glands are normal. Stomach/Bowel: The stomach and small bowel are unremarkable. The colon and appendix are normal. Vascular/Lymphatic: No significant vascular findings are present. No enlarged abdominal or pelvic lymph nodes. Reproductive: There is a large fibroid in the uterus. Other: No abdominal wall hernia or abnormality. No abdominopelvic ascites. Musculoskeletal: There is a sclerotic lesion within L2 which is ill-defined and nonspecific. A sclerotic focus is seen at L1 as well. IMPRESSION: 1. There is urothelial enhancement in the left renal pelvis and proximal ureter with a small amount of adjacent fat stranding and perinephric fluid. These findings are likely infectious. No associated parenchymal changes are seen in the left kidney. Recommend correlation with history, physical exam, and urinalysis. 2. Sclerotic lesion in L2. Sclerotic focus at L1. These findings are nonspecific. If the patient has outside imaging, recommend comparison. Otherwise, recommend a bone scan for further evaluation. 3. Large fibroid in the uterus. 4. Low-attenuation lesions in the spleen are nonspecific but of doubtful acute significance. Electronically Signed   By: Dorise Bullion III M.D   On: 03/23/2019 20:42    Pending Labs Unresulted Labs (From admission, onward)    Start     Ordered   03/24/19 0500  Hemoglobin A1c  Tomorrow morning,   R    Comments: To assess prior glycemic control    03/23/19 2201   03/24/19 [id number]  Basic metabolic panel  Tomorrow morning,   R     03/23/19 2201   03/24/19 0500  CBC  Tomorrow morning,   R     03/23/19 2201   03/23/19 2156  HIV Antibody (routine testing w rflx)  (HIV Antibody (Routine testing w reflex) panel)  Once,   STAT     03/23/19 2201   03/23/19 2100  SARS CORONAVIRUS 2 (TAT 6-24 HRS) Nasopharyngeal Nasopharyngeal Swab  (Asymptomatic/Tier 2 Patients Labs)  Once,   STAT    Question Answer Comment  Is this test for diagnosis or  screening Screening   Symptomatic for COVID-19 as defined by CDC No   Hospitalized for COVID-19 No   Admitted to ICU for COVID-19 No   Previously tested for COVID-19 No   Resident in a congregate (group) care setting No   Employed in healthcare setting No   Pregnant No      03/23/19 2100   03/23/19 2046  Urine culture  ONCE - STAT,   STAT     03/23/19 2045          Vitals/Pain Today's Vitals   03/23/19 2100 03/23/19 2200 03/23/19 2236 03/23/19 2250  BP: 135/76 139/67 136/68   Pulse: (!) 101 98 (!) 105 99  Resp: 16 16 18 18   Temp:      TempSrc:      SpO2: 100% 100% 100% 100%  PainSc:        Isolation Precautions No active isolations  Medications Medications  sodium chloride (PF) 0.9 % injection (has no administration in time range)  0.9 %  sodium chloride infusion (has no administration in time range)  acetaminophen (TYLENOL) tablet 650 mg (has no administration in time range)    Or  acetaminophen (TYLENOL) suppository 650 mg (has no administration in time range)  traMADol (  ULTRAM) tablet 50-100 mg (has no administration in time range)  ketorolac (TORADOL) 30 MG/ML injection 30 mg (has no administration in time range)  ondansetron (ZOFRAN) tablet 4 mg (has no administration in time range)    Or  ondansetron (ZOFRAN) injection 4 mg (has no administration in time range)  insulin aspart (novoLOG) injection 0-9 Units (has no administration in time range)  lisinopril (ZESTRIL) tablet 40 mg (has no administration in time range)  hydrALAZINE (APRESOLINE) injection 5-10 mg (has no administration in time range)  cefTRIAXone (ROCEPHIN) 1 g in sodium chloride 0.9 % 100 mL IVPB (has no administration in time range)  sodium chloride flush (NS) 0.9 % injection 3 mL (3 mLs Intravenous Given 03/23/19 1837)  fentaNYL (SUBLIMAZE) injection 50 mcg (50 mcg Intravenous Given 03/23/19 1823)  sodium chloride 0.9 % bolus 500 mL (0 mLs Intravenous Stopped 03/23/19 1923)  ondansetron (ZOFRAN)  injection 4 mg (4 mg Intravenous Given 03/23/19 1822)  iohexol (OMNIPAQUE) 300 MG/ML solution 100 mL (100 mLs Intravenous Contrast Given 03/23/19 2006)  cefTRIAXone (ROCEPHIN) 1 g in sodium chloride 0.9 % 100 mL IVPB (0 g Intravenous Stopped 03/23/19 2231)  sodium chloride 0.9 % bolus 500 mL (0 mLs Intravenous Stopped 03/23/19 2232)  insulin aspart (novoLOG) injection 3 Units (3 Units Subcutaneous Given 03/23/19 2235)    Mobility walks

## 2019-03-23 NOTE — ED Triage Notes (Signed)
Pt presents with abd pain and n/v x 1 week.  Pt states that the pain has gotten worse.  Pt states she is scheduled for surgery on nov 9.  Pt a/o x 4.

## 2019-03-23 NOTE — ED Notes (Signed)
Pt ambulated to BR with no assist. Attempting to give urine specimen.

## 2019-03-23 NOTE — H&P (Signed)
History and Physical    Ebony Holland [id number] DOB: 16-Jul-1965 DOA: 03/23/2019  PCP: Tamala Julian, MD Patient coming from: Home  Chief Complaint: Abd pain  HPI: Ebony Holland is a 53 y.o. female with medical history significant of DM, HTN, Uterine Fibroids, presenting w/ 7 days of LLQ abd pain. Initially intermitten but now constant. Worse w/ certain movement. No change in bowel habits. Associated w/ body aches and general feeling of being unwell. Endorses progressive urinary frequency nausea and vomiting. Unable to keep down home medications for days. Denies CP, SOB, LE swelling, hematuria, dysuria.   Scheduled for Uterine Fibroid surgery on 04/02/19.    ED Course: objective findings below. Started on fluids and Rocephin  Review of Systems: As per HPI otherwise all other systems reviewed and are negative  Ambulatory Status: No restrictions  Past Medical History:  Diagnosis Date  . Breast lump    left  . Diabetes mellitus without complication ([place])   . Hypertension     Past Surgical History:  Procedure Laterality Date  . CESAREAN SECTION N/A 05/25/1991  . TUBAL LIGATION Bilateral     Social History   Socioeconomic History  . Marital status: Legally Separated    Spouse name: Not on file  . Number of children: Not on file  . Years of education: Not on file  . Highest education level: Not on file  Occupational History  . Not on file  Social Needs  . Financial resource strain: Not on file  . Food insecurity    Worry: Not on file    Inability: Not on file  . Transportation needs    Medical: Not on file    Non-medical: Not on file  Tobacco Use  . Smoking status: Former Research scientist (life sciences)  . Smokeless tobacco: Never Used  Substance and Sexual Activity  . Alcohol use: Not Currently  . Drug use: Not on file  . Sexual activity: Not on file  Lifestyle  . Physical activity    Days per week: Not on file    Minutes per session: Not on file  . Stress: Not on file   Relationships  . Social Herbalist on phone: Not on file    Gets together: Not on file    Attends religious service: Not on file    Active member of club or organization: Not on file    Attends meetings of clubs or organizations: Not on file    Relationship status: Not on file  . Intimate partner violence    Fear of current or ex partner: Not on file    Emotionally abused: Not on file    Physically abused: Not on file    Forced sexual activity: Not on file  Other Topics Concern  . Not on file  Social History Narrative  . Not on file    No Known Allergies  Family History  Problem Relation Age of Onset  . Breast cancer Mother       Prior to Admission medications   Medication Sig Start Date End Date Taking? Authorizing Provider  cholecalciferol (VITAMIN D3) 25 MCG (1000 UT) tablet Take 1,000 Units by mouth daily.   Yes [provider]  glipiZIDE (GLUCOTROL) 5 MG tablet Take by mouth 2 (two) times daily.   Yes [provider]  ibuprofen (ADVIL) 600 MG tablet Take 600 mg by mouth every 6 (six) hours as needed.   Yes [provider]  lisinopril (ZESTRIL) 40 MG tablet Take 40  mg by mouth daily. 12/24/15  Yes [provider]  Multiple Vitamin (MULTIVITAMIN) capsule Take 1 capsule by mouth daily.   Yes [provider]  pioglitazone (ACTOS) 30 MG tablet Take 30 mg by mouth daily.   Yes [provider]    Physical Exam: Vitals:   03/23/19 1927 03/23/19 2000 03/23/19 2030 03/23/19 2100  BP: 138/77 135/73 130/76 135/76  Pulse: 99 98 (!) 101 (!) 101  Resp: 15 16 16 16   Temp:      TempSrc:      SpO2: 100% 100% 100% 100%     General:  Appears calm and comfortable Eyes:  PERRL, EOMI, normal lids, iris ENT:  grossly normal hearing, lips & tongue, mmm Neck:  no LAD, masses or thyromegaly Cardiovascular:  RRR, no m/r/g. No LE edema.  Respiratory:  CTA bilaterally, no w/r/r. Normal respiratory effort. Abdomen:  soft,  ntnd, NABS, ttp on L  Skin:  no rash or induration seen on limited exam Musculoskeletal:  CVA tenderness on L. grossly normal tone BUE/BLE, good ROM, no bony abnormality Psychiatric:  grossly normal mood and affect, speech fluent and appropriate, AOx3 Neurologic:  CN 2-12 grossly intact, moves all extremities in coordinated fashion, sensation intact  Labs on Admission: I have personally reviewed following labs and imaging studies  CBC: Recent Labs  Lab 03/23/19 1745  WBC 13.9*  HGB 12.4  HCT 40.1  MCV 73.4*  PLT [id number]   Basic Metabolic Panel: Recent Labs  Lab 03/23/19 1745  NA 133*  K 4.1  CL 96*  CO2 23  GLUCOSE 246*  BUN 13  CREATININE 0.91  CALCIUM 9.9   GFR: Estimated Creatinine Clearance: 68.7 mL/min (by C-G formula based on SCr of 0.91 mg/dL). Liver Function Tests: Recent Labs  Lab 03/23/19 1745  AST 23  ALT 14  ALKPHOS 60  BILITOT 1.1  PROT 9.7*  ALBUMIN 4.6   Recent Labs  Lab 03/23/19 1745  LIPASE 24   No results for input(s): AMMONIA in the last 168 hours. Coagulation Profile: No results for input(s): INR, PROTIME in the last 168 hours. Cardiac Enzymes: No results for input(s): CKTOTAL, CKMB, CKMBINDEX, TROPONINI in the last 168 hours. BNP (last 3 results) No results for input(s): PROBNP in the last 8760 hours. HbA1C: No results for input(s): HGBA1C in the last 72 hours. CBG: No results for input(s): GLUCAP in the last 168 hours. Lipid Profile: No results for input(s): CHOL, HDL, LDLCALC, TRIG, CHOLHDL, LDLDIRECT in the last 72 hours. Thyroid Function Tests: No results for input(s): TSH, T4TOTAL, FREET4, T3FREE, THYROIDAB in the last 72 hours. Anemia Panel: No results for input(s): VITAMINB12, FOLATE, FERRITIN, TIBC, IRON, RETICCTPCT in the last 72 hours. Urine analysis:    Component Value Date/Time   COLORURINE YELLOW 03/23/2019 1915   APPEARANCEUR HAZY (A) 03/23/2019 1915   LABSPEC 1.013 03/23/2019 1915   PHURINE 5.0 03/23/2019 1915    GLUCOSEU >=500 (A) 03/23/2019 1915   HGBUR SMALL (A) 03/23/2019 1915   BILIRUBINUR NEGATIVE 03/23/2019 1915   KETONESUR 80 (A) 03/23/2019 1915   PROTEINUR >=300 (A) 03/23/2019 1915   UROBILINOGEN 0.2 09/02/2008 1135   NITRITE NEGATIVE 03/23/2019 1915   LEUKOCYTESUR SMALL (A) 03/23/2019 1915    Creatinine Clearance: Estimated Creatinine Clearance: 68.7 mL/min (by C-G formula based on SCr of 0.91 mg/dL).  Sepsis Labs: @LABRCNTIP (procalcitonin:4,lacticidven:4) )No results found for this or any previous visit (from the past 240 hour(s)).   Radiological Exams on Admission: Ct Abdomen Pelvis W Contrast  Result Date: 03/23/2019 CLINICAL DATA:  Abdominal pain for a week. EXAM: CT ABDOMEN AND PELVIS WITH CONTRAST TECHNIQUE: Multidetector CT imaging of the abdomen and pelvis was performed using the standard protocol following bolus administration of intravenous contrast. CONTRAST:  195mL OMNIPAQUE IOHEXOL 300 MG/ML  SOLN COMPARISON:  None. FINDINGS: Lower chest: No acute abnormality. Hepatobiliary: No focal liver abnormality is seen. No gallstones, gallbladder wall thickening, or biliary dilatation. Pancreas: Unremarkable. No pancreatic ductal dilatation or surrounding inflammatory changes. Spleen: Low-attenuation regions in the spleen are nonspecific but may represent cysts or hemangiomas. Adrenals/Urinary Tract: Adrenal glands are normal. There is urothelial enhancement in the left renal pelvis and proximal left ureter. There is a small amount of adjacent fat stranding and a small amount of perinephric fluid in this region. The remainder of the left ureter is normal with no stones. No evidence of pyelonephritis. The kidneys are otherwise normal. The right ureter is normal. The bladder is unremarkable. Adrenal glands are normal. Stomach/Bowel: The stomach and small bowel are unremarkable. The colon and appendix are normal. Vascular/Lymphatic: No significant vascular findings are present. No enlarged  abdominal or pelvic lymph nodes. Reproductive: There is a large fibroid in the uterus. Other: No abdominal wall hernia or abnormality. No abdominopelvic ascites. Musculoskeletal: There is a sclerotic lesion within L2 which is ill-defined and nonspecific. A sclerotic focus is seen at L1 as well. IMPRESSION: 1. There is urothelial enhancement in the left renal pelvis and proximal ureter with a small amount of adjacent fat stranding and perinephric fluid. These findings are likely infectious. No associated parenchymal changes are seen in the left kidney. Recommend correlation with history, physical exam, and urinalysis. 2. Sclerotic lesion in L2. Sclerotic focus at L1. These findings are nonspecific. If the patient has outside imaging, recommend comparison. Otherwise, recommend a bone scan for further evaluation. 3. Large fibroid in the uterus. 4. Low-attenuation lesions in the spleen are nonspecific but of doubtful acute significance. Electronically Signed   By: Dorise Bullion III M.D   On: 03/23/2019 20:42     Assessment/Plan Active Problems:   Pyelonephritis   Pyelonephritis: L ureter mostly involved on CT w/o obstructive disease noted. WBC 13.9. UA shows infection. CVA tenderness on L. Dehydrated and in pain at time of admission. No Sepsis.  - IVF - continue Ceftriaxone.  - f/u UCX - NSAIDs  HTN: Elevated likely from missing lisinopril and pain. Tachycardia from pain and dehydration. - resume Lisinopril in am - Hydralazine IV PRN  DM: Unable to tolerate Actos and Glipizide for days. Glucose 246 on admission. Ketones in urine. No acidosis. H/o GI intolerance to metformin IR, and Lantus. - IVF - Novolog TIDAC - Novolog 3 units x1 now  Hyponatremia/Hypochloremia: Likely from GI loss and poor oral intake. Very mild - IVF - BMP in am - zofran PRN  Uterine Fibroid: Chronic. Set for surgery on 04/02/19. Non-contributory to current hospitalization.   COVID: No sx or suspected contacts - COVID  Test pending per EDP   DVT prophylaxis: SCD - Early ambulation  Code Status: obs  Family Communication: husband  Disposition Plan: obs - anticipate DC in am  Consults called: none  Admission status: observation    Waldemar Dickens MD [facility]  If 7PM-7AM, please contact night-coverage [email or web address] Password [email or web address]  03/23/2019, 9:45 PM

## 2019-03-23 NOTE — Plan of Care (Signed)
Plan of care discussed.   

## 2019-03-23 NOTE — ED Notes (Signed)
This RN attempted to start IV x 2 w/o any success. This Probation officer has asked another RN to attempt IV start on this patient.

## 2019-03-24 DIAGNOSIS — D509 Iron deficiency anemia, unspecified: Secondary | ICD-10-CM | POA: Diagnosis not present

## 2019-03-24 DIAGNOSIS — E860 Dehydration: Secondary | ICD-10-CM | POA: Diagnosis present

## 2019-03-24 DIAGNOSIS — E1169 Type 2 diabetes mellitus with other specified complication: Secondary | ICD-10-CM | POA: Diagnosis not present

## 2019-03-24 DIAGNOSIS — N938 Other specified abnormal uterine and vaginal bleeding: Secondary | ICD-10-CM | POA: Diagnosis present

## 2019-03-24 DIAGNOSIS — Z803 Family history of malignant neoplasm of breast: Secondary | ICD-10-CM | POA: Diagnosis not present

## 2019-03-24 DIAGNOSIS — Z79899 Other long term (current) drug therapy: Secondary | ICD-10-CM | POA: Diagnosis not present

## 2019-03-24 DIAGNOSIS — I10 Essential (primary) hypertension: Secondary | ICD-10-CM | POA: Diagnosis present

## 2019-03-24 DIAGNOSIS — Z86001 Personal history of in-situ neoplasm of cervix uteri: Secondary | ICD-10-CM | POA: Diagnosis not present

## 2019-03-24 DIAGNOSIS — B9620 Unspecified Escherichia coli [E. coli] as the cause of diseases classified elsewhere: Secondary | ICD-10-CM | POA: Diagnosis present

## 2019-03-24 DIAGNOSIS — Z20828 Contact with and (suspected) exposure to other viral communicable diseases: Secondary | ICD-10-CM | POA: Diagnosis present

## 2019-03-24 DIAGNOSIS — E782 Mixed hyperlipidemia: Secondary | ICD-10-CM | POA: Diagnosis present

## 2019-03-24 DIAGNOSIS — Z7984 Long term (current) use of oral hypoglycemic drugs: Secondary | ICD-10-CM | POA: Diagnosis not present

## 2019-03-24 DIAGNOSIS — E871 Hypo-osmolality and hyponatremia: Secondary | ICD-10-CM | POA: Diagnosis present

## 2019-03-24 DIAGNOSIS — E878 Other disorders of electrolyte and fluid balance, not elsewhere classified: Secondary | ICD-10-CM | POA: Diagnosis present

## 2019-03-24 DIAGNOSIS — D259 Leiomyoma of uterus, unspecified: Secondary | ICD-10-CM | POA: Diagnosis present

## 2019-03-24 DIAGNOSIS — E119 Type 2 diabetes mellitus without complications: Secondary | ICD-10-CM | POA: Diagnosis present

## 2019-03-24 DIAGNOSIS — N10 Acute tubulo-interstitial nephritis: Secondary | ICD-10-CM | POA: Diagnosis present

## 2019-03-24 DIAGNOSIS — D500 Iron deficiency anemia secondary to blood loss (chronic): Secondary | ICD-10-CM | POA: Diagnosis present

## 2019-03-24 DIAGNOSIS — Z87891 Personal history of nicotine dependence: Secondary | ICD-10-CM | POA: Diagnosis not present

## 2019-03-24 DIAGNOSIS — N12 Tubulo-interstitial nephritis, not specified as acute or chronic: Secondary | ICD-10-CM | POA: Diagnosis present

## 2019-03-24 LAB — GLUCOSE, CAPILLARY
Glucose-Capillary: 128 mg/dL — ABNORMAL HIGH (ref 70–99)
Glucose-Capillary: 126 mg/dL — ABNORMAL HIGH (ref 70–99)
Glucose-Capillary: 116 mg/dL — ABNORMAL HIGH (ref 70–99)
Glucose-Capillary: 227 mg/dL — ABNORMAL HIGH (ref 70–99)
Glucose-Capillary: 238 mg/dL — ABNORMAL HIGH (ref 70–99)
Glucose-Capillary: 238 mg/dL — ABNORMAL HIGH (ref 70–99)
Glucose-Capillary: 201 mg/dL — ABNORMAL HIGH (ref 70–99)

## 2019-03-24 LAB — CBC
HCT: 29.1 % — ABNORMAL LOW (ref 36.0–46.0)
Hemoglobin: 9.5 g/dL — ABNORMAL LOW (ref 12.0–15.0)
MCH: 23.5 pg — ABNORMAL LOW (ref 26.0–34.0)
MCHC: 32.6 g/dL (ref 30.0–36.0)
MCV: 71.9 fL — ABNORMAL LOW (ref 80.0–100.0)
Platelets: 412 10*3/uL — ABNORMAL HIGH (ref 150–400)
RBC: 4.05 MIL/uL (ref 3.87–5.11)
RDW: 19.5 % — ABNORMAL HIGH (ref 11.5–15.5)
WBC: 8.7 10*3/uL (ref 4.0–10.5)
nRBC: 0 % (ref 0.0–0.2)

## 2019-03-24 LAB — BASIC METABOLIC PANEL
Anion gap: 10 (ref 5–15)
BUN: 11 mg/dL (ref 6–20)
CO2: 23 mmol/L (ref 22–32)
Calcium: 8.4 mg/dL — ABNORMAL LOW (ref 8.9–10.3)
Chloride: 105 mmol/L (ref 98–111)
Creatinine, Ser: 0.72 mg/dL (ref 0.44–1.00)
GFR calc Af Amer: >60 mL/min (ref >60)
GFR calc non Af Amer: >60 mL/min (ref >60)
Glucose, Bld: 132 mg/dL — ABNORMAL HIGH (ref 70–99)
Potassium: 3.5 mmol/L (ref 3.5–5.1)
Sodium: 138 mmol/L (ref 135–145)

## 2019-03-24 LAB — HEMOGLOBIN A1C
Hgb A1c MFr Bld: 9.2 % — ABNORMAL HIGH (ref 4.8–5.6)
Mean Plasma Glucose: 217.34 mg/dL

## 2019-03-24 LAB — HIV ANTIBODY (ROUTINE TESTING W REFLEX): HIV Screen 4th Generation wRfx: NONREACTIVE

## 2019-03-24 LAB — SARS CORONAVIRUS 2 (TAT 6-24 HRS): SARS Coronavirus 2: NEGATIVE

## 2019-03-24 NOTE — Progress Notes (Signed)
PROGRESS NOTE  Ebony Holland [id number] DOB: [birth year] DOA: 03/23/2019 PCP: Tamala Julian, MD  Brief History   53 year old woman PMH diabetes mellitus type 2, fibroids, presented with 7 days left lower quadrant abdominal pain, progressive urinary frequency, nausea and vomiting.  Admitted for acute left-sided pyelonephritis.  A & P  Acute left-sided pyelonephritis without obstruction.  CT abdomen pelvis showed urothelial enhancement left renal pelvis likely infectious. --Improving but still has left-sided abdominal pain and flank pain.  Continue IV antibiotics.  Microcytic anemia --Secondary to dysfunctional uterine bleeding.  Follow-up with GYN as an outpatient.  Diabetes mellitus type 2, has been unable to tolerate Actos and glipizide for several days.  Ketones in urine on admission but no acidosis.  Hemoglobin A1c 9.2. --CBG stable.  Continue sliding scale insulin.  Hyponatremia, hypochloremia likely secondary to GI loss and poor oral intake --Resolved.  Essential hypertension --Lisinopril  Sclerotic lesion in L2. Sclerotic focus at L1.  Incidental finding.  These findings are nonspecific. If the patient has outside imaging, recommend comparison. Otherwise, recommend a bone scan for further evaluation. --Follow-up as an outpatient recommended  PMH uterine fibroids, CIN III followed by Dr. Denman George.  Surgery planned November 9.  DVT prophylaxis: SCDs Code Status: Full Family Communication: husband at bedside Disposition Plan: home    Murray Hodgkins, MD  [facility] Direct contact: see [email or web address] (further directions at bottom of note if needed) 7PM-7AM contact night coverage as at bottom of note 03/24/2019, 3:20 PM  LOS: 0 days   Significant Hospital Events   .    Consults:  .    Procedures:  .   Significant Diagnostic Tests:  Marland Kitchen    Micro Data:  . 10/30 urine culture   Antimicrobials:  .   Interval History/Subjective  Feels better.  Still has  left-sided abdominal and left flank pain.  Objective   Vitals:  Vitals:   03/24/19 0815 03/24/19 1300  BP: (!) 142/72 (!) 111/57  Pulse: (!) 109 96  Resp:  18  Temp: 98.2 F (36.8 C) 97.9 F (36.6 C)  SpO2: 99% 100%    Exam:  Constitutional:  . Appears calm and comfortable ENMT:  . grossly normal hearing  Respiratory:  . CTA bilaterally, no w/r/r.  . Respiratory effort normal.  Cardiovascular:  . RRR, no m/r/g . No LE extremity edema   Abdomen:  . Soft, nondistended, left sided abdominal pain in the left flank pain, CVA tenderness is marked. Psychiatric:  . Mental status o Mood, affect appropriate   I have personally reviewed the following:   Today's Data  . BMP unremarkable.  Anion gap within normal limits.  CO2 23. Marland Kitchen Hemoglobin 9.5.   Scheduled Meds: . insulin aspart  0-9 Units Subcutaneous TID WC  . lisinopril  40 mg Oral Daily   Continuous Infusions: . sodium chloride 125 mL/hr at 03/24/19 0707  . cefTRIAXone (ROCEPHIN)  IV      Principal Problem:   Pyelonephritis Active Problems:   Type 2 diabetes mellitus with other specified complication (HCC)   Uterine fibroid   Hyponatremia   Essential hypertension   Microcytic anemia   LOS: 0 days   How to contact the [facility] Attending or Consulting provider [place] or covering provider during after hours [place], for this patient?  1. Check the care team in [facility] and look for a) attending/consulting [facility] provider listed and b) the [facility] team listed 2. Log into [email or web address] and use [place]'s universal password to access.  If you do not have the password, please contact the hospital operator. 3. Locate the [facility] provider you are looking for under [facility] and page to a number that you can be directly reached. 4. If you still have difficulty reaching the provider, please page the [facility] (Director on Call) for the Hospitalists listed on amion for assistance.

## 2019-03-25 LAB — GLUCOSE, CAPILLARY: Glucose-Capillary: 151 mg/dL — ABNORMAL HIGH (ref 70–99)

## 2019-03-25 MED ORDER — FLUCONAZOLE 150 MG PO TABS: 150.0000 mg | ORAL_TABLET | Freq: Once | ORAL | 0 refills | Status: AC

## 2019-03-25 MED ORDER — FLUCONAZOLE 150 MG PO TABS
150.0000 mg | ORAL_TABLET | Freq: Once | ORAL | Status: AC
  Administered 2019-03-25: 150 mg via ORAL
  Filled 2019-03-25: qty 1

## 2019-03-25 MED ORDER — CEPHALEXIN 500 MG PO CAPS: 500.0000 mg | ORAL_CAPSULE | Freq: Four times a day (QID) | ORAL | 0 refills | Status: AC

## 2019-03-25 NOTE — Discharge Summary (Signed)
Physician Discharge Summary  Patient ID: Ebony Holland MRN: [id number] DOB/AGE: Oct 16, 1965 53 y.o.  Admit date: 03/23/2019 Discharge date: 03/25/2019  Admission Diagnoses:  Discharge Diagnoses:  Principal Problem:   Pyelonephritis Active Problems:   Type 2 diabetes mellitus with other specified complication ([place])   Uterine fibroid   Hyponatremia   Essential hypertension   Microcytic anemia   Discharged Condition: stable  Hospital Course: Patient is a 53 year old woman with past medical history significant for diabetes mellitus type 2, fibroids, hypertension, mixed dyslipidemia, breast lump and cervical intraepithelial neoplasia III.  Patient was admitted with 7-day history of left lower quadrant abdominal pain, progressive urinary frequency, nausea and vomiting.  Patient was admitted and managed for acute left-sided pyelonephritis, microcytic anemia likely secondary to dysfunctional uterine bleeding, electrolyte abnormality and diabetes mellitus.  Urine culture grew pansensitive E. coli.  CT scan of the abdomen and pelvis with contrast revealed urothelial enhancement in the left renal pelvis and proximal ureter with a small amount of adjacent fat stranding and perinephric fluid.  Patient will be discharged on oral cephalexin to complete course of antibiotics treatment.  Patient will follow with a primary care provider on discharge.  Consults: None  Significant Diagnostic Studies:  Urine culture grew E. coli, pansensitive.  CT scan of the abdomen and pelvis with contrast revealed: 1. There is urothelial enhancement in the left renal pelvis and proximal ureter with a small amount of adjacent fat stranding and perinephric fluid. These findings are likely infectious. No associated parenchymal changes are seen in the left kidney. Recommend correlation with history, physical exam, and urinalysis. 2. Sclerotic lesion in L2. Sclerotic focus at L1. These findings are nonspecific. If the  patient has outside imaging, recommend comparison. Otherwise, recommend a bone scan for further evaluation. 3. Large fibroid in the uterus. 4. Low-attenuation lesions in the spleen are nonspecific but of doubtful acute significance.   Discharge Exam: Blood pressure 129/68, pulse 84, temperature 98 F (36.7 C), temperature source Oral, resp. rate 17, height 5\' 4"  (1.626 m), weight 63.8 kg, last menstrual period 03/06/2019, SpO2 98 %.  Disposition: Discharge disposition: 01-Home or Self Care   Discharge Instructions    Diet - low sodium heart healthy   Complete by: As directed    Increase activity slowly   Complete by: As directed      Allergies as of 03/25/2019   No Known Allergies     Medication List    STOP taking these medications   ibuprofen 600 MG tablet Commonly known as: ADVIL     TAKE these medications   cephALEXin 500 MG capsule Commonly known as: KEFLEX Take 1 capsule (500 mg total) by mouth 4 (four) times daily for 7 days.   cholecalciferol 25 MCG (1000 UT) tablet Commonly known as: VITAMIN D3 Take 1,000 Units by mouth daily.   fluconazole 150 MG tablet Commonly known as: Diflucan Take 1 tablet (150 mg total) by mouth once for 1 dose. Take on 04/01/2019   glipiZIDE 5 MG tablet Commonly known as: GLUCOTROL Take by mouth 2 (two) times daily.   lisinopril 40 MG tablet Commonly known as: ZESTRIL Take 40 mg by mouth daily.   multivitamin capsule Take 1 capsule by mouth daily.   pioglitazone 30 MG tablet Commonly known as: ACTOS Take 30 mg by mouth daily.        SignedBonnell Public 03/25/2019, 11:31 AM

## 2019-03-26 LAB — URINE CULTURE: Culture: 30000 — AB

## 2019-03-27 ENCOUNTER — Telehealth: Payer: Self-pay

## 2019-03-27 NOTE — Telephone Encounter (Signed)
Told Ms Thissell that Dr. Denman George said that she can proceed with the surgery on 04-02-19 as noted below by [name], NP.

## 2019-03-27 NOTE — Telephone Encounter (Signed)
Message Received: Today Message Contents  Cross, Carollee Massed, NP  Baruch Merl, RN        Ebony Holland is fine to proceed with the conization. She should continue on her abxs for the pyelo.

## 2019-03-27 NOTE — Telephone Encounter (Signed)
Ebony Holland is working from home so she will begin leave on 04-02-19.  She will return to work on 04-09-19. Information relayed to [name], NP.

## 2019-03-29 ENCOUNTER — Other Ambulatory Visit (HOSPITAL_COMMUNITY)
Admission: RE | Admit: 2019-03-29 | Discharge: 2019-03-29 | Disposition: A | Discharge status: Home / Self Care | Payer: Commercial Managed Care - PPO | Source: Ambulatory Visit | Attending: Gynecologic Oncology | Admitting: Gynecologic Oncology

## 2019-03-29 DIAGNOSIS — Z01812 Encounter for preprocedural laboratory examination: Secondary | ICD-10-CM | POA: Insufficient documentation

## 2019-03-29 DIAGNOSIS — Z20828 Contact with and (suspected) exposure to other viral communicable diseases: Secondary | ICD-10-CM | POA: Diagnosis not present

## 2019-03-30 ENCOUNTER — Telehealth: Payer: Self-pay

## 2019-03-30 ENCOUNTER — Other Ambulatory Visit: Payer: Self-pay

## 2019-03-30 ENCOUNTER — Encounter (HOSPITAL_BASED_OUTPATIENT_CLINIC_OR_DEPARTMENT_OTHER): Payer: Self-pay | Admitting: *Deleted

## 2019-03-30 LAB — NOVEL CORONAVIRUS, NAA (HOSP ORDER, SEND-OUT TO REF LAB; TAT 18-24 HRS): SARS-CoV-2, NAA: NOT DETECTED

## 2019-03-30 NOTE — Telephone Encounter (Signed)
Ebony Holland states that she understands instructions. Reviewed dates that pt will be out of work for [employer]. It will be 04-02-19 through 04-08-19 and returning on 04-09-19.

## 2019-03-30 NOTE — Progress Notes (Signed)
Spoke w/ via phone for pre-op interview--- PT Lab needs dos----  EKG             Lab results------ CBC/BMP/ A1c done 03-24-2019 and negative urine preg done 03-23-2019 w/ chart/ epic COVID test ------ 03-29-2019 Arrive at ------- 0800 NPO after ------ MN Medications to take morning of surgery ----- NONE Diabetic medication ----- do not take take diabetic medication morning of surgery and no lisinopril Patient Special Instructions ----- n/a Pre-Op special Istructions ----- n/a Patient verbalized understanding of instructions that were given at this phone interview. Patient denies shortness of breath, chest pain, fever, cough a this phone interview.

## 2019-04-02 ENCOUNTER — Encounter (HOSPITAL_BASED_OUTPATIENT_CLINIC_OR_DEPARTMENT_OTHER): Payer: Self-pay | Admitting: *Deleted

## 2019-04-02 ENCOUNTER — Encounter (HOSPITAL_BASED_OUTPATIENT_CLINIC_OR_DEPARTMENT_OTHER)
Admission: RE | Disposition: A | Discharge status: Home / Self Care | Payer: Self-pay | Source: Ambulatory Visit | Attending: Gynecologic Oncology

## 2019-04-02 ENCOUNTER — Ambulatory Visit (HOSPITAL_BASED_OUTPATIENT_CLINIC_OR_DEPARTMENT_OTHER): Payer: Commercial Managed Care - PPO | Admitting: Anesthesiology

## 2019-04-02 ENCOUNTER — Ambulatory Visit (HOSPITAL_BASED_OUTPATIENT_CLINIC_OR_DEPARTMENT_OTHER)
Admission: RE | Admit: 2019-04-02 | Discharge: 2019-04-02 | Disposition: A | Discharge status: Home / Self Care | Payer: Commercial Managed Care - PPO | Source: Ambulatory Visit | Attending: Gynecologic Oncology | Admitting: Gynecologic Oncology

## 2019-04-02 ENCOUNTER — Other Ambulatory Visit: Payer: Self-pay

## 2019-04-02 DIAGNOSIS — I10 Essential (primary) hypertension: Secondary | ICD-10-CM | POA: Insufficient documentation

## 2019-04-02 DIAGNOSIS — D069 Carcinoma in situ of cervix, unspecified: Secondary | ICD-10-CM | POA: Diagnosis present

## 2019-04-02 DIAGNOSIS — Z7984 Long term (current) use of oral hypoglycemic drugs: Secondary | ICD-10-CM | POA: Insufficient documentation

## 2019-04-02 DIAGNOSIS — E119 Type 2 diabetes mellitus without complications: Secondary | ICD-10-CM | POA: Diagnosis not present

## 2019-04-02 DIAGNOSIS — D060 Carcinoma in situ of endocervix: Secondary | ICD-10-CM | POA: Insufficient documentation

## 2019-04-02 DIAGNOSIS — E663 Overweight: Secondary | ICD-10-CM | POA: Insufficient documentation

## 2019-04-02 DIAGNOSIS — D252 Subserosal leiomyoma of uterus: Secondary | ICD-10-CM | POA: Diagnosis not present

## 2019-04-02 DIAGNOSIS — Z6824 Body mass index (BMI) 24.0-24.9, adult: Secondary | ICD-10-CM | POA: Insufficient documentation

## 2019-04-02 DIAGNOSIS — Z87891 Personal history of nicotine dependence: Secondary | ICD-10-CM | POA: Insufficient documentation

## 2019-04-02 HISTORY — DX: Type 2 diabetes mellitus without complications: E11.9

## 2019-04-02 HISTORY — PX: CERVICAL CONIZATION W/BX: SHX1330

## 2019-04-02 HISTORY — DX: Presence of spectacles and contact lenses: Z97.3

## 2019-04-02 HISTORY — DX: Carcinoma in situ of cervix, unspecified: D06.9

## 2019-04-02 HISTORY — DX: Mixed hyperlipidemia: E78.2

## 2019-04-02 HISTORY — DX: Personal history of other diseases of urinary system: Z87.448

## 2019-04-02 HISTORY — DX: Leiomyoma of uterus, unspecified: D25.9

## 2019-04-02 LAB — GLUCOSE, CAPILLARY
Glucose-Capillary: 85 mg/dL (ref 70–99)
Glucose-Capillary: 75 mg/dL (ref 70–99)

## 2019-04-02 SURGERY — CONE BIOPSY, CERVIX
Anesthesia: Monitor Anesthesia Care | Site: Vagina

## 2019-04-02 MED ORDER — MIDAZOLAM HCL 2 MG/2ML IJ SOLN
INTRAMUSCULAR | Status: AC
  Filled 2019-04-02: qty 2

## 2019-04-02 MED ORDER — LIDOCAINE 2% (20 MG/ML) 5 ML SYRINGE
INTRAMUSCULAR | Status: DC | PRN
  Administered 2019-04-02: 100 mg via INTRAVENOUS

## 2019-04-02 MED ORDER — KETOROLAC TROMETHAMINE 30 MG/ML IJ SOLN
INTRAMUSCULAR | Status: AC
  Filled 2019-04-02: qty 1

## 2019-04-02 MED ORDER — ACETAMINOPHEN 500 MG PO TABS
1000.0000 mg | ORAL_TABLET | Freq: Once | ORAL | Status: AC
  Administered 2019-04-02: 09:00:00 1000 mg via ORAL
  Filled 2019-04-02: qty 2

## 2019-04-02 MED ORDER — OXYCODONE HCL 5 MG PO TABS
5.0000 mg | ORAL_TABLET | Freq: Once | ORAL | Status: AC | PRN
  Administered 2019-04-02: 5 mg via ORAL
  Filled 2019-04-02: qty 1

## 2019-04-02 MED ORDER — LIDOCAINE-EPINEPHRINE 1 %-1:100000 IJ SOLN
INTRAMUSCULAR | Status: DC | PRN
  Administered 2019-04-02: 16 mL

## 2019-04-02 MED ORDER — PROPOFOL 10 MG/ML IV BOLUS
INTRAVENOUS | Status: DC | PRN
  Administered 2019-04-02: 50 mg via INTRAVENOUS
  Administered 2019-04-02: 40 mg via INTRAVENOUS

## 2019-04-02 MED ORDER — ACETAMINOPHEN 10 MG/ML IV SOLN
INTRAVENOUS | Status: AC
  Filled 2019-04-02: qty 100

## 2019-04-02 MED ORDER — PROPOFOL 10 MG/ML IV BOLUS
INTRAVENOUS | Status: AC
  Filled 2019-04-02: qty 20

## 2019-04-02 MED ORDER — SENNA 8.6 MG PO TABS: 1.0000 | ORAL_TABLET | Freq: Every day | ORAL | 0 refills | Status: DC

## 2019-04-02 MED ORDER — ONDANSETRON HCL 4 MG/2ML IJ SOLN
INTRAMUSCULAR | Status: DC | PRN
  Administered 2019-04-02: 4 mg via INTRAVENOUS

## 2019-04-02 MED ORDER — ACETAMINOPHEN 500 MG PO TABS
ORAL_TABLET | ORAL | Status: AC
  Filled 2019-04-02: qty 2

## 2019-04-02 MED ORDER — KETOROLAC TROMETHAMINE 30 MG/ML IJ SOLN
30.0000 mg | Freq: Once | INTRAMUSCULAR | Status: DC
  Filled 2019-04-02: qty 1

## 2019-04-02 MED ORDER — FENTANYL CITRATE (PF) 100 MCG/2ML IJ SOLN
INTRAMUSCULAR | Status: AC
  Filled 2019-04-02: qty 2

## 2019-04-02 MED ORDER — MIDAZOLAM HCL 5 MG/5ML IJ SOLN
INTRAMUSCULAR | Status: DC | PRN
  Administered 2019-04-02: 2 mg via INTRAVENOUS

## 2019-04-02 MED ORDER — PROMETHAZINE HCL 25 MG/ML IJ SOLN
6.2500 mg | INTRAMUSCULAR | Status: DC | PRN
  Filled 2019-04-02: qty 1

## 2019-04-02 MED ORDER — OXYCODONE HCL 5 MG PO TABS: 5.0000 mg | ORAL_TABLET | ORAL | 0 refills | Status: DC | PRN

## 2019-04-02 MED ORDER — FERRIC SUBSULFATE SOLN
Status: DC | PRN
  Administered 2019-04-02: 1 via TOPICAL

## 2019-04-02 MED ORDER — DEXAMETHASONE SODIUM PHOSPHATE 10 MG/ML IJ SOLN
INTRAMUSCULAR | Status: DC | PRN
  Administered 2019-04-02: 4 mg via INTRAVENOUS

## 2019-04-02 MED ORDER — OXYCODONE HCL 5 MG/5ML PO SOLN
5.0000 mg | Freq: Once | ORAL | Status: AC | PRN
  Filled 2019-04-02: qty 5

## 2019-04-02 MED ORDER — KETOROLAC TROMETHAMINE 30 MG/ML IJ SOLN
INTRAMUSCULAR | Status: DC | PRN
  Administered 2019-04-02: 30 mg via INTRAVENOUS

## 2019-04-02 MED ORDER — PROPOFOL 500 MG/50ML IV EMUL
INTRAVENOUS | Status: DC | PRN
  Administered 2019-04-02: 75 ug/kg/min via INTRAVENOUS

## 2019-04-02 MED ORDER — LIDOCAINE 2% (20 MG/ML) 5 ML SYRINGE
INTRAMUSCULAR | Status: AC
  Filled 2019-04-02: qty 5

## 2019-04-02 MED ORDER — OXYCODONE HCL 5 MG PO TABS
ORAL_TABLET | ORAL | Status: AC
  Filled 2019-04-02: qty 1

## 2019-04-02 MED ORDER — LACTATED RINGERS IV SOLN
INTRAVENOUS | Status: DC
  Administered 2019-04-02: 09:00:00 via INTRAVENOUS
  Filled 2019-04-02: qty 1000

## 2019-04-02 MED ORDER — ACETIC ACID 5 % SOLN
Status: DC | PRN
  Administered 2019-04-02: 1 via TOPICAL

## 2019-04-02 MED ORDER — FENTANYL CITRATE (PF) 100 MCG/2ML IJ SOLN
INTRAMUSCULAR | Status: DC | PRN
  Administered 2019-04-02 (×2): 50 ug via INTRAVENOUS

## 2019-04-02 MED ORDER — FENTANYL CITRATE (PF) 100 MCG/2ML IJ SOLN
25.0000 ug | INTRAMUSCULAR | Status: DC | PRN
  Filled 2019-04-02: qty 1

## 2019-04-02 MED ORDER — DEXAMETHASONE SODIUM PHOSPHATE 10 MG/ML IJ SOLN
INTRAMUSCULAR | Status: AC
  Filled 2019-04-02: qty 1

## 2019-04-02 MED ORDER — KETOROLAC TROMETHAMINE 30 MG/ML IJ SOLN
30.0000 mg | Freq: Once | INTRAMUSCULAR | Status: DC | PRN
  Filled 2019-04-02: qty 1

## 2019-04-02 MED ORDER — ONDANSETRON HCL 4 MG/2ML IJ SOLN
INTRAMUSCULAR | Status: AC
  Filled 2019-04-02: qty 2

## 2019-04-02 MED ORDER — IBUPROFEN 800 MG PO TABS: 800.0000 mg | ORAL_TABLET | Freq: Three times a day (TID) | ORAL | 0 refills | Status: DC | PRN

## 2019-04-02 SURGICAL SUPPLY — 24 items
APPLICATOR COTTON TIP 6IN STRL (MISCELLANEOUS) ×4 IMPLANT
BLADE EXTENDED COATED 6.5IN (ELECTRODE) ×2 IMPLANT
BLADE SURG 11 STRL SS (BLADE) ×2 IMPLANT
CANISTER SUCT 3000ML PPV (MISCELLANEOUS) IMPLANT
CATH ROBINSON RED A/P 16FR (CATHETERS) ×2 IMPLANT
COVER WAND RF STERILE (DRAPES) ×2 IMPLANT
DRSG TELFA 3X8 NADH (GAUZE/BANDAGES/DRESSINGS) ×2 IMPLANT
ELECT BALL LEEP 5MM RED (ELECTRODE) IMPLANT
GLOVE BIO SURGEON STRL SZ 6 (GLOVE) ×2 IMPLANT
GOWN STRL REUS W/ TWL LRG LVL3 (GOWN DISPOSABLE) ×1 IMPLANT
GOWN STRL REUS W/TWL LRG LVL3 (GOWN DISPOSABLE) ×1
KIT TURNOVER CYSTO (KITS) ×2 IMPLANT
NS IRRIG 500ML POUR BTL (IV SOLUTION) ×2 IMPLANT
PACK VAGINAL WOMENS (CUSTOM PROCEDURE TRAY) ×2 IMPLANT
PAD PREP 24X48 CUFFED NSTRL (MISCELLANEOUS) ×2 IMPLANT
SPONGE SURGIFOAM ABS GEL 12-7 (HEMOSTASIS) IMPLANT
SUT VIC AB 0 CT1 36 (SUTURE) ×2 IMPLANT
SUT VIC AB 2-0 CT2 27 (SUTURE) IMPLANT
SUT VIC AB 2-0 UR6 27 (SUTURE) IMPLANT
SUT VICRYL 0 UR6 27IN ABS (SUTURE) ×4 IMPLANT
SWAB OB GYN 8IN STERILE 2PK (MISCELLANEOUS) IMPLANT
SYR BULB IRRIGATION 50ML (SYRINGE) ×2 IMPLANT
TUBE CONNECTING 12X1/4 (SUCTIONS) ×2 IMPLANT
VACUUM HOSE/TUBING 7/8INX6FT (MISCELLANEOUS) IMPLANT

## 2019-04-02 NOTE — Anesthesia Postprocedure Evaluation (Signed)
Anesthesia Post Note  Patient: Ebony Holland  Procedure(s) Performed: CONIZATION CERVIX WITH BIOPSY (N/A Vagina )     Patient location during evaluation: PACU Anesthesia Type: MAC Level of consciousness: awake and alert and oriented Pain management: pain level controlled Vital Signs Assessment: post-procedure vital signs reviewed and stable Respiratory status: spontaneous breathing, nonlabored ventilation and respiratory function stable Cardiovascular status: blood pressure returned to baseline Postop Assessment: no apparent nausea or vomiting Anesthetic complications: no    Last Vitals:  Vitals:   04/02/19 1030 04/02/19 1045  BP: 139/70 (!) 143/75  Pulse: 96 85  Resp: 13 14  Temp:    SpO2: 100% 99%    Last Pain:  Vitals:   04/02/19 1045  TempSrc:   PainSc: 0-No pain                 Brennan Bailey

## 2019-04-02 NOTE — Transfer of Care (Signed)
Immediate Anesthesia Transfer of Care Note  Patient: Caidynce Muzyka  Procedure(s) Performed: CONIZATION CERVIX WITH BIOPSY (N/A Vagina )  Patient Location: PACU  Anesthesia Type:MAC  Level of Consciousness: drowsy  Airway & Oxygen Therapy: Patient Spontanous Breathing and Patient connected to nasal cannula oxygen  Post-op Assessment: Report given to RN  Post vital signs: Reviewed and stable  Last Vitals: 119/69 Vitals Value Taken Time  BP    Temp    Pulse 96 04/02/19 1016  Resp 13 04/02/19 1016  SpO2 100 % 04/02/19 1016  Vitals shown include unvalidated device data.  Last Pain:  Vitals:   04/02/19 0815  TempSrc: Oral         Complications: No apparent anesthesia complications

## 2019-04-02 NOTE — Op Note (Signed)
OPERATIVE NOTE  PATIENT: Ebony Holland DATE: 04/02/19   Preop Diagnosis: CIN 3  Postoperative Diagnosis: same  Surgery: cold knife conization of cervix   Surgeons:  Donaciano Eva, MD Assistant: none  Anesthesia: General   Estimated blood loss: <38ml  IVF:  246ml   Urine output: 50 ml   Complications: None   Pathology: cervical cone with marking stitch at 12 o'clock, post cone ECC  Operative findings: no gross ectocervical disease. Acetowhite changes to transformation zone. No gross malignancy visible or palpable.  Procedure: The patient was identified in the preoperative holding area. Informed consent was signed on the chart. Patient was seen history was reviewed and exam was performed.   The patient was then taken to the operating room and placed in the supine position with SCD hose on. General anesthesia was then induced without difficulty. She was then placed in the dorsolithotomy position. The perineum was prepped with Betadine. The vagina was prepped with Betadine. The patient was then draped after the prep was dried. An in and out catheterization to empty the bladder was performed under sterile conditions.  Timeout was performed the patient, procedure, antibiotic, allergy, and length of procedure.   The weighted speculum was placed in the posterior vagina. The right angle retractor was placed anteriorally to visualize the cervix. A 0-vicryl suture on a UR-6 needle was used to place stay sutures (which were tagged) at 3 and 9 o'clock of the cervicovaginal junction. 10cc of 1% lidocaine with epinephrine was infiltrated into 3 and 9 o'clock of the cervicovaginal junction, circumferentially around the face of the cervix, and deep in the endocervical canal. The uterine sound was placed in the cervix to delineate the course of the endocervical canal. An 11 blade scalpel on a long knife handle was used to make an incision around the face of the cervix, inside of the  stay sutures, circumferentially. A single tooth tenaculum grasped the specimen to manipulate it. The incision was then angled towards the endocervix to amputate the specimen. It was removed, oriented with a marking stitch at the 12 o'clock ectocervix and sent to pathology. It was measured to be 3cm deep, by 2x2cm on the ectocervical face.   A post-cone ECC was collected from the endocervical canal using a kavorkian currette.  The bovie with the 37mm ball attachment was used at 45 coag to create hemostasis at the surgical bed. Monsels solution was applied to the surgical bed to consolidate this hemostasis.  The vagina was irrigated.  All instrument, suture, laparotomy, Ray-Tec, and needle counts were correct x2. The patient tolerated the procedure well and was taken recovery room in stable condition. This is Everitt Amber dictating an operative note on Ebony Holland.  Thereasa Solo, MD

## 2019-04-02 NOTE — Anesthesia Preprocedure Evaluation (Addendum)
Anesthesia Evaluation  Patient identified by MRN, date of birth, ID band Patient awake    Reviewed: Allergy & Precautions, NPO status , Patient's Chart, lab work & pertinent test results  History of Anesthesia Complications Negative for: history of anesthetic complications  Airway Mallampati: II  TM Distance: >3 FB Neck ROM: Full    Dental no notable dental hx.    Pulmonary neg pulmonary ROS,    Pulmonary exam normal        Cardiovascular hypertension, Pt. on medications Normal cardiovascular exam     Neuro/Psych negative neurological ROS  negative psych ROS   GI/Hepatic negative GI ROS, Neg liver ROS,   Endo/Other  diabetes, Type 2, Oral Hypoglycemic Agents  Renal/GU negative Renal ROS  negative genitourinary   Musculoskeletal negative musculoskeletal ROS (+)   Abdominal   Peds  Hematology  (+) anemia , Hgb 9.5   Anesthesia Other Findings Day of surgery medications reviewed with patient.  Reproductive/Obstetrics negative OB ROS                            Anesthesia Physical Anesthesia Plan  ASA: II  Anesthesia Plan: MAC   Post-op Pain Management:    Induction:   PONV Risk Score and Plan: 2 and Treatment may vary due to age or medical condition, Propofol infusion, Ondansetron and Midazolam  Airway Management Planned: Natural Airway and Simple Face Mask  Additional Equipment: None  Intra-op Plan:   Post-operative Plan:   Informed Consent: I have reviewed the patients History and Physical, chart, labs and discussed the procedure including the risks, benefits and alternatives for the proposed anesthesia with the patient or authorized representative who has indicated his/her understanding and acceptance.     Dental advisory given  Plan Discussed with: CRNA  Anesthesia Plan Comments:        Anesthesia Quick Evaluation

## 2019-04-02 NOTE — Discharge Instructions (Signed)
Cold Knife Conization, Care After ACTIVITY  Rest as much as possible the first two days after discharge.  You do not have weight restrictions on lifting  Avoid strenuous working out such as running or lifting weights for 48-72 hours  You can climb stairs and drive a car NUTRITION  You may resume your normal diet.  Drink 6 to 8 glasses of fluids a day.  Eat a healthy, balanced diet including portions of food from the meat (protein), milk, fruit, vegetable, and bread groups.  Your caregiver may recommend you take a multivitamin with iron.  ELIMINATION   If constipation occurs, drink more liquids, and add more fruits, vegetables, and bran to your diet. You may take a mild laxative, such as Milk of Magnesia, Metamucil, or a stool softener such as Colace, with permission from your caregiver.  HYGIENE  You may shower and wash your hair.  Avoid tub baths for 4 weeks  Do not add any bath oils or chemicals to your bath water, after you have permission to take baths.  Avoid placing anything in the vagina for 4 weeks.  It is normal to pass a brown-flecked discharge from the vagina for several weeks after your procedure.  HOME CARE INSTRUCTIONS   Take your temperature twice a day and record it, especially if you feel feverish or have chills.  Follow your caregiver's instructions about medicines, activity, and follow-up appointments after surgery.  Do not drink alcohol while taking pain medicine.  You may take over-the-counter medicine for pain, recommended by your caregiver.  If your pain is not relieved with medicine, call your caregiver.  Do not take aspirin because it can cause bleeding.  Do not douche or use tampons (use a nonperfumed sanitary pad).  Do not have sexual intercourse for 6 weeks postoperatively. Hugging, kissing, and playful sexual activity is fine with your caregiver's permission.  Take showers instead of baths, until your caregiver gives you permission  to take baths.  You may take a mild medicine for constipation, recommended by your caregiver. Bran foods and drinking a lot of fluids will help with constipation.  Make sure your family understands everything about your operation and recovery.  SEEK MEDICAL CARE IF:    You notice a foul smell coming from the vagina.  You have painful or bloody urination.  You develop nausea and vomiting.  You develop diarrhea.  You develop a rash.  You have a reaction or allergy from the medicine.  You feel dizzy or light-headed.  You need stronger pain medicine.   SEEK IMMEDIATE MEDICAL CARE IF:   You develop a temperature of 102 F (38.9 C) or higher.  You pass out.  You develop leg or chest pain.  You develop abdominal pain.  You develop shortness of breath.  You bleed heavier than a period (soaking through 2 or more pads per hour for 2 hours in a row).  You see pus in the wound area.  MAKE SURE YOU:   Understand these instructions.  Will watch your condition.  Will get help right away if you are not doing well or get worse. Document Released: 12/23/2003 Document Revised: 09/24/2013 Document Reviewed: 04/11/2009 [facility] Patient Information 2015 [place], [place]. This information is not intended to replace advice given to you by your health care provider. Make sure you discuss any questions you have with your health care provider.   Post Anesthesia Home Care Instructions  Activity: Get plenty of rest for the remainder of the day. A responsible  individual must stay with you for 24 hours following the procedure.  For the next 24 hours, DO NOT: -Drive a car -Paediatric nurse -Drink alcoholic beverages -Take any medication unless instructed by your physician -Make any legal decisions or sign important papers.  Meals: Start with liquid foods such as gelatin or soup. Progress to regular foods as tolerated. Avoid greasy, spicy, heavy foods. If nausea and/or vomiting  occur, drink only clear liquids until the nausea and/or vomiting subsides. Call your physician if vomiting continues.  Special Instructions/Symptoms: Your throat may feel dry or sore from the anesthesia or the breathing tube placed in your throat during surgery. If this causes discomfort, gargle with warm salt water. The discomfort should disappear within 24 hours.  If you had a scopolamine patch placed behind your ear for the management of post- operative nausea and/or vomiting:  1. The medication in the patch is effective for 72 hours, after which it should be removed.  Wrap patch in a tissue and discard in the trash. Wash hands thoroughly with soap and water. 2. You may remove the patch earlier than 72 hours if you experience unpleasant side effects which may include dry mouth, dizziness or visual disturbances. 3. Avoid touching the patch. Wash your hands with soap and water after contact with the patch.

## 2019-04-02 NOTE — Anesthesia Procedure Notes (Signed)
Procedure Name: MAC Date/Time: 04/02/2019 9:40 AM Performed by: Bonney Aid, CRNA Pre-anesthesia Checklist: Patient identified, Emergency Drugs available, Suction available, Patient being monitored and Timeout performed Patient Re-evaluated:Patient Re-evaluated prior to induction Oxygen Delivery Method: Simple face mask and Nasal cannula Placement Confirmation: positive ETCO2

## 2019-04-02 NOTE — Interval H&P Note (Signed)
History and Physical Interval Note:  04/02/2019 9:30 AM  Ebony Holland  has presented today for surgery, with the diagnosis of CIN 3.  The various methods of treatment have been discussed with the patient and family. After consideration of risks, benefits and other options for treatment, the patient has consented to  Procedure(s): CONIZATION CERVIX WITH BIOPSY (N/A) as a surgical intervention.  The patient's history has been reviewed, patient examined, no change in status, stable for surgery.  I have reviewed the patient's chart and labs.  Questions were answered to the patient's satisfaction.     Thereasa Solo

## 2019-04-03 ENCOUNTER — Telehealth: Payer: Self-pay

## 2019-04-03 ENCOUNTER — Encounter (HOSPITAL_BASED_OUTPATIENT_CLINIC_OR_DEPARTMENT_OTHER): Payer: Self-pay | Admitting: Gynecologic Oncology

## 2019-04-03 NOTE — Telephone Encounter (Signed)
I attempted to contact patient for post op call.  VM mailbox full. I was unable to leave a message.

## 2019-04-03 NOTE — Telephone Encounter (Signed)
Pt returned our call.  She is doing well. She denies n/v/c/fever.  She is having some cramping but this is relieved by motrin. She is having some vaginal bleeding but that is decreasing in amount.  I encouraged her to call with any questions or concerns. She verbalized understanding.

## 2019-04-05 LAB — SURGICAL PATHOLOGY

## 2019-04-06 ENCOUNTER — Telehealth: Payer: Self-pay

## 2019-04-06 NOTE — Telephone Encounter (Signed)
I spoke with MS Paiva this am.  I let her know the surgical pathology showed precancerous changes.  I told her Dr. Denman George would discuss recommendations for moving forward at her appt on 11/25.  She stated she is still having some cramping.  I encouraged her to take ibuprofen and use a heating pad.  She verbalized understanding.

## 2019-04-23 ENCOUNTER — Encounter: Payer: Self-pay | Admitting: Gynecologic Oncology

## 2019-04-23 ENCOUNTER — Other Ambulatory Visit: Payer: Self-pay

## 2019-04-23 ENCOUNTER — Inpatient Hospital Stay: Payer: Commercial Managed Care - PPO | Attending: Gynecologic Oncology | Admitting: Gynecologic Oncology

## 2019-04-23 VITALS — BP 153/90 | HR 104 | Temp 98.2°F | Resp 16 | Ht 64.0 in | Wt 144.4 lb

## 2019-04-23 DIAGNOSIS — D069 Carcinoma in situ of cervix, unspecified: Secondary | ICD-10-CM | POA: Diagnosis present

## 2019-04-23 DIAGNOSIS — E119 Type 2 diabetes mellitus without complications: Secondary | ICD-10-CM | POA: Insufficient documentation

## 2019-04-23 DIAGNOSIS — D259 Leiomyoma of uterus, unspecified: Secondary | ICD-10-CM

## 2019-04-23 DIAGNOSIS — I10 Essential (primary) hypertension: Secondary | ICD-10-CM | POA: Diagnosis not present

## 2019-04-23 DIAGNOSIS — D219 Benign neoplasm of connective and other soft tissue, unspecified: Secondary | ICD-10-CM

## 2019-04-23 MED ORDER — SENNOSIDES-DOCUSATE SODIUM 8.6-50 MG PO TABS: 2.0000 | ORAL_TABLET | Freq: Every day | ORAL | 1 refills | Status: DC

## 2019-04-23 MED ORDER — IBUPROFEN 800 MG PO TABS: 800.0000 mg | ORAL_TABLET | Freq: Three times a day (TID) | ORAL | 1 refills | Status: DC | PRN

## 2019-04-23 MED ORDER — OXYCODONE HCL 5 MG PO TABS: 5.0000 mg | ORAL_TABLET | ORAL | 0 refills | Status: DC | PRN

## 2019-04-23 NOTE — Progress Notes (Signed)
Follow-up Note: Gyn-Onc  Consult was initially requested by Dr. Wilhelmenia Blase for the evaluation of Ebony Holland 53 y.o. female  CC:  Chief Complaint  Patient presents with  . CIN 3  . Fibroids    Assessment/Plan:  Ebony Holland  is a 53 y.o.  year old with CIN III and symptomatic uterine fibroids.  CKC showed CIN 3 with negative margins (CIN 1 at the ectocervical margin).  The patient is interested in definitive surgery for her fibroids with hysterectomy and bilateral salpingectomy. I believe that we can attempt this robotically, but may required a minilaparotomy for specimen retrieval. I explained that this will not eliminate her need for annual paps to monitor for vaginal dysplasia. I explained that she has a 20% risk for vaginal dysplasia after hysterectomy which would require surgical or medical intervention, and carries a persistent risk for vaginal cancer. Therefore, in other words, the hysterectomy is treatment for her fibroids, but not her cervical dysplasia.  We discussed the procedure, its risks including  bleeding, infection, damage to internal organs (such as bladder,ureters, bowels), blood clot, reoperation and rehospitalization. We discussed anticipated recovery and restrictions.   She has a lot of pelvic floor spasm causing discomfort and dyspareunia. I explained that I didn't feel this would be helped by surgery, and if it is still present postop, she will need to be referred to a pelvic pain specialist for non-surgical treatment of this.  Surgery is scheduled for February 9th, 2021. We will check HbA1c preop. If it is >8% we will postpone her surgery.   HPI: Ebony Holland is a 53 year old P4 who was seen in consultation at the request of [place] for evaluation of CIN-3 and symptomatic uterine fibroids.  The patient reported having an abnormal Pap test during her work-up for abnormal uterine bleeding.  This was performed in February 06, 2019 and revealed H SIL  with negative high-risk HPV.  Trichomonas was also present.  She reported that 3 years prior to this Pap test she also had an abnormal Pap test that did not require intervention.  Following her H SIL Pap she then proceeded to have a colposcopic evaluation of the cervix on February 15, 2019.  The colposcopic exam was unsatisfactory.  The entire transformation zone was not visualized.  There were acetowhite changes at 11:00.  Biopsies were taken from the ectocervix which revealed benign cervical mucosa and from the endocervix a curettage was performed which showed CIN-2 to 3.  With respect to her fibroid symptoms she reports menorrhagia menorrhagia for approximately 5 to 6 months.  She is never required a blood transfusion.  She reports increasing pelvic pain and intolerance with pelvic examinations.  Ultrasound dimensions of the uterus on February 15, 2019 revealed a uterus measuring 11.15 x 8.46 x 4.71 cm with an endometrial thickness of 6.8 mm.  The left and right ovaries were all less than 3 cm and normal.  The largest fibroid measured 6.8 cm.  It was subserosal and extending off the fundus.  Otherwise her medical history is remarkable for hypertension and diabetes mellitus.  Her diabetes is type II.  She no longer takes insulin.  She reports that in October 2020 her hemoglobin A1c was 7.1.  She is overweight with a BMI of 25 but no diabetes.  She is a 1 private cesarean section and 3 prior vaginal births but denies other abdominal surgeries and has never had a myomectomy.  Interval Hx:  On April 02, 2019 she underwent cold knife  conization of the cervix. Intraoperative findings were significant for no gross ectocervical disease. Surgery was uncomplicated.  Final pathology revealed CIN-3 with extensive involvement of endocervical glands.  The margins of the specimen were negative for high-grade dysplasia including the post cone ECC.  However CIN-1 involve the ectocervical margin.  Since  surgery she did well healing from her current.  She developed pyelonephritis.  Her blood glucose was poorly controlled with a hemoglobin A1c of 9.2% in October 2020.  She had a CT scan of the abdomen pelvis which confirmed pyelonephritis and a bulky fibroid uterus.  She was started on an insulin regimen and has much better blood glucose control.  Current Meds:  Outpatient Encounter Medications as of 04/23/2019  Medication Sig  . cholecalciferol (VITAMIN D3) 25 MCG (1000 UT) tablet Take 1,000 Units by mouth daily.  Marland Kitchen glipiZIDE (GLUCOTROL) 5 MG tablet Take by mouth 2 (two) times daily.  Marland Kitchen ibuprofen (ADVIL) 800 MG tablet Take 1 tablet (800 mg total) by mouth every 8 (eight) hours as needed.  Marland Kitchen lisinopril (ZESTRIL) 40 MG tablet Take 40 mg by mouth daily.  . Multiple Vitamin (MULTIVITAMIN) capsule Take 1 capsule by mouth daily.  . pioglitazone (ACTOS) 30 MG tablet Take 30 mg by mouth daily.  . [DISCONTINUED] oxyCODONE (OXY IR/ROXICODONE) 5 MG immediate release tablet Take 1 tablet (5 mg total) by mouth every 4 (four) hours as needed for severe pain.  . [DISCONTINUED] senna (SENOKOT) 8.6 MG TABS tablet Take 1 tablet (8.6 mg total) by mouth at bedtime.   No facility-administered encounter medications on file as of 04/23/2019.     Allergy: No Known Allergies  Social Hx:   Social History   Socioeconomic History  . Marital status: Legally Separated    Spouse name: Not on file  . Number of children: Not on file  . Years of education: Not on file  . Highest education level: Not on file  Occupational History  . Not on file  Social Needs  . Financial resource strain: Not on file  . Food insecurity    Worry: Not on file    Inability: Not on file  . Transportation needs    Medical: Not on file    Non-medical: Not on file  Tobacco Use  . Smoking status: Never Smoker  . Smokeless tobacco: Never Used  Substance and Sexual Activity  . Alcohol use: Not Currently  . Drug use: Never  . Sexual  activity: Not on file  Lifestyle  . Physical activity    Days per week: Not on file    Minutes per session: Not on file  . Stress: Not on file  Relationships  . Social Herbalist on phone: Not on file    Gets together: Not on file    Attends religious service: Not on file    Active member of club or organization: Not on file    Attends meetings of clubs or organizations: Not on file    Relationship status: Not on file  . Intimate partner violence    Fear of current or ex partner: Not on file    Emotionally abused: Not on file    Physically abused: Not on file    Forced sexual activity: Not on file  Other Topics Concern  . Not on file  Social History Narrative  . Not on file    Past Surgical Hx:  Past Surgical History:  Procedure Laterality Date  . CERVICAL CONIZATION W/BX N/A 04/02/2019  Procedure: CONIZATION CERVIX WITH BIOPSY;  Surgeon: Everitt Amber, MD;  Location: [facility];  Service: Gynecology;  Laterality: N/A;  . CESAREAN SECTION N/A 05/25/1991  . TUBAL LIGATION Bilateral 1995    Past Medical Hx:  Past Medical History:  Diagnosis Date  . Breast lump    left  . CIN III (cervical intraepithelial neoplasia III)   . History of pyelonephritis    03-23-2019  . Hypertension    followed by pcp  (03-30-2019  per pt never had a stress test)  . Mixed dyslipidemia   . Type 2 diabetes mellitus [facility])    endocrinologist--- dr doer ([facility] in [facility])----  checks cbg in morning,  fasting cbg-- 153  . Uterine fibroid   . Wears glasses     Past Gynecological History:  No bleeding No LMP recorded.  Family Hx:  Family History  Problem Relation Age of Onset  . Breast cancer Mother     Review of Systems:  Constitutional  Feels well,    ENT Normal appearing ears and nares bilaterally Skin/Breast  No rash, sores, jaundice, itching, dryness Cardiovascular  No chest pain, shortness of breath, or edema  Pulmonary  No cough or wheeze.   Gastro Intestinal  No nausea, vomitting, or diarrhoea. No bright red blood per rectum, no abdominal pain, change in bowel movement, or constipation.  Genito Urinary  No frequency, urgency, dysuria, + abnormal vaginal bleeding, + pelvic pains Musculo Skeletal  No myalgia, arthralgia, joint swelling or pain  Neurologic  No weakness, numbness, change in gait,  Psychology  No depression, anxiety, insomnia.   Vitals:  Blood pressure (!) 153/90, pulse (!) 104, temperature 98.2 F (36.8 C), temperature source Temporal, resp. rate 16, height 5\' 4"  (1.626 m), weight 144 lb 7 oz (65.5 kg), SpO2 100 %.  Physical Exam: WD in NAD Neck  Supple NROM, without any enlargements.  Lymph Node Survey No cervical supraclavicular or inguinal adenopathy Cardiovascular  Pulse normal rate, regularity and rhythm. S1 and S2 normal.  Lungs  Clear to auscultation bilateraly, without wheezes/crackles/rhonchi. Good air movement.  Skin  No rash/lesions/breakdown  Psychiatry  Alert and oriented to person, place, and time  Abdomen  Normoactive bowel sounds, abdomen soft, non-tender and nonobese without evidence of hernia. Back No CVA tenderness Genito Urinary  Vulva/vagina: Normal external female genitalia.  No lesions. No discharge or bleeding.  Bladder/urethra:  No lesions or masses, well supported bladder  Vagina: normal in appearance, tender at introitus and pelvic floor  Cervix: healing well. Normal cone base.   Uterus: Bulky, 14+ cm, mobile, no parametrial involvement or nodularity. Exam limited secondary to patient intolerance to exam.  Adnexa: no palpable masses. Rectal  deferred Extremities  No bilateral cyanosis, clubbing or edema.   20 minutes of direct face to face counseling time was spent with the patient. This included discussion about prognosis, therapy recommendations and postoperative side effects and are beyond the scope of routine postoperative care.   Thereasa Solo, MD   04/23/2019, 4:28 PM

## 2019-04-23 NOTE — Patient Instructions (Addendum)
Preparing for your Surgery  Plan for surgery on July 03, 2019 with Dr. Everitt Amber at [place] will be scheduled for a robotic assisted total laparoscopic hysterectomy greater than 250 grams, bilateral salpingectomy, mini laparotomy.   Pre-operative Testing -You will receive a phone call from presurgical testing at [facility] if you have not received a call already to arrange for a pre-operative testing appointment before your surgery.  This appointment normally occurs one to two weeks before your scheduled surgery.   -Bring your insurance card, copy of an advanced directive if applicable, medication list  -At that visit, you will be asked to sign a consent for a possible blood transfusion in case a transfusion becomes necessary during surgery.  The need for a blood transfusion is rare but having consent is a necessary part of your care.     -You should not be taking blood thinners or aspirin at least ten days prior to surgery unless instructed by your surgeon.  Do not take ibuprofen 10 days before surgery.  -Do not take supplements such as fish oil (omega 3), red yeast rice, tumeric before your surgery.  Day Before Surgery at [place] will be asked to take in a light diet the day before surgery.  Avoid carbonated beverages.  You will be advised to have nothing to eat or drink after midnight the evening before.    Eat a light diet the day before surgery.  Examples including soups, broths, toast, yogurt, mashed potatoes.  Things to avoid include carbonated beverages (fizzy beverages), raw fruits and raw vegetables, or beans.   If your bowels are filled with gas, your surgeon will have difficulty visualizing your pelvic organs which increases your surgical risks.  Your role in recovery Your role is to become active as soon as directed by your doctor, while still giving yourself time to heal.  Rest when you feel tired. You will be asked to do the following in order  to speed your recovery:  - Cough and breathe deeply. This helps toclear and expand your lungs and can prevent pneumonia.  - Do mild physical activity. Walking or moving your legs help your circulation and body functions return to normal. A staff member will help you when you try to walk and will provide you with simple exercises. Do not try to get up or walk alone the first time. - Actively manage your pain. Managing your pain lets you move in comfort. We will ask you to rate your pain on a scale of zero to 10. It is your responsibility to tell your doctor or nurse where and how much you hurt so your pain can be treated.  Special Considerations -If you are diabetic, you may be placed on insulin after surgery to have closer control over your blood sugars to promote healing and recovery.  This does not mean that you will be discharged on insulin.  If applicable, your oral antidiabetics will be resumed when you are tolerating a solid diet.  -Your final pathology results from surgery should be available around one week after surgery and the results will be relayed to you when available.  -Dr. Lahoma Crocker is the surgeon that assists your GYN Oncologist with surgery.  If you end up staying the night, the next day after your surgery you will either see Dr. Denman George or Dr. Lahoma Crocker.  -FMLA forms can be faxed to [phone] and please allow 5-7 business days for completion.  Pain Management After  Surgery -You have been prescribed your pain medication and bowel regimen medications before surgery so that you can have these available when you are discharged from the hospital. The pain medication is for use ONLY AFTER surgery and a new prescription will not be given.   -Make sure that you have Tylenol and Ibuprofen at home to use on a regular basis after surgery for pain control. We recommend alternating the medications every hour to six hours since they work differently and are processed in the  body differently for pain relief.  -Review the attached handout on narcotic use and their risks and side effects.   Bowel Regimen -You have been prescribed Sennakot-S to take nightly to prevent constipation especially if you are taking the narcotic pain medication intermittently.  It is important to prevent constipation and drink adequate amounts of liquids.  Blood Transfusion Information WHAT IS A BLOOD TRANSFUSION? A transfusion is the replacement of blood or some of its parts. Blood is made up of multiple cells which provide different functions.  Red blood cells carry oxygen and are used for blood loss replacement.  White blood cells fight against infection.  Platelets control bleeding.  Plasma helps clot blood.  Other blood products are available for specialized needs, such as hemophilia or other clotting disorders. BEFORE THE TRANSFUSION  Who gives blood for transfusions?   You may be able to donate blood to be used at a later date on yourself (autologous donation).  Relatives can be asked to donate blood. This is generally not any safer than if you have received blood from a stranger. The same precautions are taken to ensure safety when a relative's blood is donated.  Healthy volunteers who are fully evaluated to make sure their blood is safe. This is blood bank blood. Transfusion therapy is the safest it has ever been in the practice of medicine. Before blood is taken from a donor, a complete history is taken to make sure that person has no history of diseases nor engages in risky social behavior (examples are intravenous drug use or sexual activity with multiple partners). The donor's travel history is screened to minimize risk of transmitting infections, such as malaria. The donated blood is tested for signs of infectious diseases, such as HIV and hepatitis. The blood is then tested to be sure it is compatible with you in order to minimize the chance of a transfusion reaction. If  you or a relative donates blood, this is often done in anticipation of surgery and is not appropriate for emergency situations. It takes many days to process the donated blood. RISKS AND COMPLICATIONS Although transfusion therapy is very safe and saves many lives, the main dangers of transfusion include:   Getting an infectious disease.  Developing a transfusion reaction. This is an allergic reaction to something in the blood you were given. Every precaution is taken to prevent this. The decision to have a blood transfusion has been considered carefully by your caregiver before blood is given. Blood is not given unless the benefits outweigh the risks.  AFTER SURGERY INSTRUCTIONS 04/23/2019  Return to work: 4-6 weeks if applicable  Activity: 1. Be up and out of the bed during the day.  Take a nap if needed.  You may walk up steps but be careful and use the hand rail.  Stair climbing will tire you more than you think, you may need to stop part way and rest.   2. No lifting or straining for 6 weeks.  3.  No driving for 1 week(s).  Do not drive if you are taking narcotic pain medicine.  4. Shower daily.  Use soap and water on your incision and pat dry; don't rub.  No tub baths until cleared by your surgeon.   5. No sexual activity and nothing in the vagina for 8 weeks.  6. You may experience a small amount of clear drainage from your incisions, which is normal.  If the drainage persists or increases, please call the office.  7. You may experience vaginal spotting after surgery or around the 6-8 week mark from surgery when the stitches at the top of the vagina begin to dissolve.  The spotting is normal but if you experience heavy bleeding, call our office.  8. Take Tylenol or ibuprofen first for pain and only use narcotic pain medication for severe pain not relieved by the Tylenol or Ibuprofen.  Monitor your Tylenol intake to a max of 4,000 mg.  Diet: 1. Low sodium Heart Healthy Diet is  recommended.  2. It is safe to use a laxative, such as Miralax or Colace, if you have difficulty moving your bowels. You can take Sennakot at bedtime every evening to keep bowel movements regular and to prevent constipation.    Wound Care: 1. Keep clean and dry.  Shower daily.  Reasons to call the Doctor:  Fever - Oral temperature greater than 100.4 degrees Fahrenheit  Foul-smelling vaginal discharge  Difficulty urinating  Nausea and vomiting  Increased pain at the site of the incision that is unrelieved with pain medicine.  Difficulty breathing with or without chest pain  New calf pain especially if only on one side  Sudden, continuing increased vaginal bleeding with or without clots.   Contacts: For questions or concerns you should contact:  Dr. Everitt Amber at [phone]  [name], NP at [phone]  After Hours: call [phone] and have the GYN Oncologist paged/contacted

## 2019-04-25 ENCOUNTER — Other Ambulatory Visit: Payer: Self-pay | Admitting: Gynecologic Oncology

## 2019-04-25 DIAGNOSIS — D069 Carcinoma in situ of cervix, unspecified: Secondary | ICD-10-CM

## 2019-06-05 ENCOUNTER — Encounter (HOSPITAL_COMMUNITY): Payer: Self-pay | Admitting: Physician Assistant

## 2019-06-06 ENCOUNTER — Telehealth: Payer: Self-pay | Admitting: Gynecologic Oncology

## 2019-06-06 NOTE — Telephone Encounter (Signed)
Attempted to call patient to discuss changes in her upcoming surgery and the need for the procedure to be placed on hold. Left message asking her to please call the office back to discuss further.

## 2019-06-08 ENCOUNTER — Telehealth: Payer: Self-pay | Admitting: Gynecologic Oncology

## 2019-06-08 ENCOUNTER — Encounter: Payer: Self-pay | Admitting: Gynecologic Oncology

## 2019-06-08 NOTE — Telephone Encounter (Signed)
Left message asking the patient to please call the office back to discuss her surgery.

## 2019-06-22 ENCOUNTER — Telehealth: Payer: Self-pay | Admitting: Gynecologic Oncology

## 2019-06-22 NOTE — Telephone Encounter (Signed)
Called patient to inform her that we have been cleared to resume elective surgeries over the next several weeks.  Advised her to please call the office to discuss potential dates.  She is currently rescheduled on Feb 18 but wanted to confirm that this date would work for her.

## 2019-06-26 ENCOUNTER — Telehealth: Payer: Self-pay | Admitting: Gynecologic Oncology

## 2019-06-26 NOTE — Telephone Encounter (Signed)
Called patient again to inform her that we have been cleared to resume elective surgeries over the next several weeks.  Advised her to please call the office to discuss potential dates.  She is currently rescheduled on Feb 18 but wanted to confirm that this date would work for her.

## 2019-06-29 ENCOUNTER — Encounter: Payer: Self-pay | Admitting: Gynecologic Oncology

## 2019-06-29 NOTE — Progress Notes (Signed)
Called patient to discuss rescheduling her surgery. Left message asking her to please call the office back if she would like to reschedule.

## 2019-07-03 ENCOUNTER — Ambulatory Visit: Admit: 2019-07-03 | Payer: Commercial Managed Care - PPO | Admitting: Gynecologic Oncology

## 2019-07-03 DIAGNOSIS — D069 Carcinoma in situ of cervix, unspecified: Secondary | ICD-10-CM

## 2019-07-03 DIAGNOSIS — D259 Leiomyoma of uterus, unspecified: Secondary | ICD-10-CM

## 2019-07-03 SURGERY — XI ROBOTIC ASSISTED LAPAROSCOPIC HYSTERECTOMY AND SALPINGECTOMY
Anesthesia: General | Laterality: Bilateral

## 2019-07-12 ENCOUNTER — Ambulatory Visit: Admit: 2019-07-12 | Payer: Commercial Managed Care - PPO | Admitting: Gynecologic Oncology

## 2019-07-12 SURGERY — XI ROBOTIC ASSISTED LAPAROSCOPIC HYSTERECTOMY AND SALPINGECTOMY
Anesthesia: General | Laterality: Bilateral

## 2019-07-18 ENCOUNTER — Telehealth: Payer: Self-pay

## 2019-07-18 NOTE — Telephone Encounter (Signed)
LM for [name] stating that Ebony Holland stated that  the available dates she can schedule surgery for are March 18,2021, April 6th or 15 th. She can call back to the office asap as these dates fill up quickly.

## 2019-07-19 ENCOUNTER — Telehealth: Payer: Self-pay | Admitting: *Deleted

## 2019-07-19 NOTE — Telephone Encounter (Signed)
Patient called and left a message that she will take the 4/6 surgery date

## 2019-07-19 NOTE — Telephone Encounter (Signed)
Pt called back and stated that April 6th would be fine for her surgery date. [name], NP notified of date.

## 2019-07-20 ENCOUNTER — Other Ambulatory Visit: Payer: Self-pay | Admitting: Gynecologic Oncology

## 2019-07-20 ENCOUNTER — Telehealth: Payer: Self-pay | Admitting: *Deleted

## 2019-07-20 DIAGNOSIS — D069 Carcinoma in situ of cervix, unspecified: Secondary | ICD-10-CM

## 2019-07-20 NOTE — Telephone Encounter (Signed)
Called the patient and requested her last A1C. Patient will have her MD office fax it over

## 2019-07-26 ENCOUNTER — Ambulatory Visit: Payer: Commercial Managed Care - PPO | Admitting: Gynecologic Oncology

## 2019-08-16 NOTE — Patient Instructions (Addendum)
DUE TO COVID-19 ONLY ONE VISITOR IS ALLOWED TO COME WITH YOU AND STAY IN THE WAITING ROOM ONLY DURING PRE OP AND PROCEDURE DAY OF SURGERY. THE 1 VISITOR MAY VISIT WITH YOU AFTER SURGERY IN YOUR PRIVATE ROOM DURING VISITING HOURS ONLY!  YOU NEED TO HAVE A COVID 19 TEST ON_4/2/21______ @_12 :30______, THIS TEST MUST BE DONE BEFORE SURGERY, COME  [place] , [id number].  ([place]) ONCE YOUR COVID TEST IS COMPLETED, PLEASE BEGIN THE QUARANTINE INSTRUCTIONS AS OUTLINED IN YOUR HANDOUT.                Satonya Jessie    Your procedure is scheduled on: 08/28/19   Report to [facility] Main  Entrance   Report to admitting at 9:20 AM     Call this number if you have problems the morning of surgery [place], NO CHEWING GUM [place].   Eat a light diet the day before surgery.  Examples including soups, broths, toast, yogurt, mashed potatoes.  Things to avoid include carbonated beverages (fizzy beverages), raw fruits and raw vegetables, or beans.   If your bowels are filled with gas, your surgeon will have difficulty visualizing your pelvic organs which increases your surgical risks.  You may have clear liquids until 8:00 am on day of surgery.   CLEAR LIQUID DIET   Foods Allowed                                                                     Foods Excluded  Coffee and tea, regular and decaf                             liquids that you cannot  Plain Jell-O any favor except red or purple                                           see through such as: Fruit ices (not with fruit pulp)                                     milk, soups, orange juice  Iced Popsicles                                    All solid food Carbonated beverages, regular and diet                                    Cranberry, grape and apple juices Sports drinks like Gatorade Lightly seasoned clear broth or  consume(fat free) Sugar, honey syrup  After 8:00 AM nothing more by mouth   Take these medicines the morning of surgery with A SIP OF WATER: None  DO NOT TAKE ANY DIABETIC MEDICATIONS DAY OF YOUR SURGERY  How  to Manage Your Diabetes Before and After Surgery  Why is it important to control my blood sugar before and after surgery? . Improving blood sugar levels before and after surgery helps healing and can limit problems. . A way of improving blood sugar control is eating a healthy diet by: o  Eating less sugar and carbohydrates o  Increasing activity/exercise o  Talking with your doctor about reaching your blood sugar goals . High blood sugars (greater than 180 mg/dL) can raise your risk of infections and slow your recovery, so you will need to focus on controlling your diabetes during the weeks before surgery. . Make sure that the doctor who takes care of your diabetes knows about your planned surgery including the date and location.  How do I manage my blood sugar before surgery? . Check your blood sugar at least 4 times a day, starting 2 days before surgery, to make sure that the level is not too high or low. o Check your blood sugar the morning of your surgery when you wake up and every 2 hours until you get to the Short Stay unit. . If your blood sugar is less than 70 mg/dL, you will need to treat for low blood sugar: o Do not take insulin. o Treat a low blood sugar (less than 70 mg/dL) with  cup of clear juice (cranberry or apple), 4 glucose tablets, OR glucose gel. o Recheck blood sugar in 15 minutes after treatment (to make sure it is greater than 70 mg/dL). If your blood sugar is not greater than 70 mg/dL on recheck, call [phone] for further instructions. . Report your blood sugar to the short stay nurse when you get to Short Stay.  . If you are admitted to the hospital after surgery: o Your blood sugar will be checked by the staff and you will probably be given insulin  after surgery (instead of oral diabetes medicines) to make sure you have good blood sugar levels. o The goal for blood sugar control after surgery is 80-180 mg/dL.   WHAT DO I DO ABOUT MY DIABETES MEDICATION?  Marland Kitchen Do not take oral diabetes medicines (pills) the morning of surgery.  . THE DAY BEFORE SURGERY, take 10    units of  [place]     insulin.       . THE MORNING OF SURGERY, take 10   units of  Lantis       insulin.  . The day of surgery, do not take other diabetes injectables, including Byetta (exenatide), Bydureon (exenatide ER), Victoza (liraglutide), or Trulicity (dulaglutide).                               You may not have any metal on your body including hair pins and              piercings  Do not wear jewelry, make-up, lotions, powders or perfumes, deodorant             Do not wear nail polish on your fingernails.  Do not shave  48 hours prior to surgery.              Do not bring valuables to the hospital. [place].  Contacts, dentures or bridgework may not be worn into surgery.      Patients discharged the  day of surgery will not be allowed to drive home  . IF YOU ARE HAVING SURGERY AND GOING HOME THE SAME DAY, YOU MUST HAVE AN ADULT TO DRIVE YOU HOME AND BE WITH YOU FOR 24 HOURS.   YOU MAY GO HOME BY TAXI OR UBER OR ORTHERWISE, BUT AN ADULT MUST ACCOMPANY YOU HOME AND STAY WITH YOU FOR 24 HOURS.  Name and phone number of your driver:  Special Instructions: N/A              Please read over the following fact sheets you were given: _____________________________________________________________________             [facility] - Preparing for Surgery Before surgery, you can play an important role.   Because skin is not sterile, your skin needs to be as free of germs as possible .  You can reduce the number of germs on your skin by washing with CHG (chlorahexidine gluconate) soap before surgery.   CHG is an  antiseptic cleaner which kills germs and bonds with the skin to continue killing germs even after washing. Please DO NOT use if you have an allergy to CHG or antibacterial soaps.   If your skin becomes reddened/irritated stop using the CHG and inform your nurse when you arrive at Short Stay. Do not shave (including legs and underarms) for at least 48 hours prior to the first CHG shower.   Please follow these instructions carefully:  1.  Shower with CHG Soap the night before surgery and the  morning of Surgery.  2.  If you choose to wash your hair, wash your hair first as usual with your  normal  shampoo.  3.  After you shampoo, rinse your hair and body thoroughly to remove the  shampoo.                                        4.  Use CHG as you would any other liquid soap.  You can apply chg directly  to the skin and wash                       Gently with a scrungie or clean washcloth.  5.  Apply the CHG Soap to your body ONLY FROM THE NECK DOWN.   Do not use on face/ open                           Wound or open sores. Avoid contact with eyes, ears mouth and genitals (private parts).                       Wash face,  Genitals (private parts) with your normal soap.             6.  Wash thoroughly, paying special attention to the area where your surgery  will be performed.  7.  Thoroughly rinse your body with warm water from the neck down.  8.  DO NOT shower/wash with your normal soap after using and rinsing off  the CHG Soap.             9.  Pat yourself dry with a clean towel.            10.  Wear clean pajamas.  11.  Place clean sheets on your bed the night of your first shower and do not  sleep with pets. Day of Surgery : Do not apply any lotions/deodorants the morning of surgery.  Please wear clean clothes to the hospital/surgery center.  FAILURE TO FOLLOW THESE INSTRUCTIONS MAY RESULT IN THE CANCELLATION OF YOUR SURGERY PATIENT SIGNATURE_________________________________  NURSE  SIGNATURE__________________________________  ________________________________________________________________________   Adam Phenix  An incentive spirometer is a tool that can help keep your lungs clear and active. This tool measures how well you are filling your lungs with each breath. Taking long deep breaths may help reverse or decrease the chance of developing breathing (pulmonary) problems (especially infection) following:  A long period of time when you are unable to move or be active. BEFORE THE PROCEDURE   If the spirometer includes an indicator to show your [place] effort, your nurse or respiratory therapist will set it to a desired goal.  If possible, sit up straight or lean slightly forward. Try not to slouch.  Hold the incentive spirometer in an upright position. INSTRUCTIONS FOR USE  1. Sit on the edge of your bed if possible, or sit up as far as you can in bed or on a chair. 2. Hold the incentive spirometer in an upright position. 3. Breathe out normally. 4. Place the mouthpiece in your mouth and seal your lips tightly around it. 5. Breathe in slowly and as deeply as possible, raising the piston or the ball toward the top of the column. 6. Hold your breath for 3-5 seconds or for as long as possible. Allow the piston or ball to fall to the bottom of the column. 7. Remove the mouthpiece from your mouth and breathe out normally. 8. Rest for a few seconds and repeat Steps 1 through 7 at least 10 times every 1-2 hours when you are awake. Take your time and take a few normal breaths between deep breaths. 9. The spirometer may include an indicator to show your [place] effort. Use the indicator as a goal to work toward during each repetition. 10. After each set of 10 deep breaths, practice coughing to be sure your lungs are clear. If you have an incision (the cut made at the time of surgery), support your incision when coughing by placing a pillow or rolled up towels firmly  against it. Once you are able to get out of bed, walk around indoors and cough well. You may stop using the incentive spirometer when instructed by your caregiver.  RISKS AND COMPLICATIONS  Take your time so you do not get dizzy or light-headed.  If you are in pain, you may need to take or ask for pain medication before doing incentive spirometry. It is harder to take a deep breath if you are having pain. AFTER USE  Rest and breathe slowly and easily.  It can be helpful to keep track of a log of your progress. Your caregiver can provide you with a simple table to help with this. If you are using the spirometer at home, follow these instructions: [place] IF:   You are having difficultly using the spirometer.  You have trouble using the spirometer as often as instructed.  Your pain medication is not giving enough relief while using the spirometer.  You develop fever of 100.5 F (38.1 C) or higher. SEEK IMMEDIATE MEDICAL CARE IF:   You cough up bloody sputum that had not been present before.  You develop fever of 102 F (38.9 C)  or greater.  You develop worsening pain at or near the incision site. MAKE SURE YOU:   Understand these instructions.  Will watch your condition.  Will get help right away if you are not doing well or get worse. Document Released: 09/20/2006 Document Revised: 08/02/2011 Document Reviewed: 11/21/2006 Exit[employer] Patient Information 2014 [employer], [place].   ________________________________________________________________________  WHAT IS A BLOOD TRANSFUSION? Blood Transfusion Information  A transfusion is the replacement of blood or some of its parts. Blood is made up of multiple cells which provide different functions.  Red blood cells carry oxygen and are used for blood loss replacement.  White blood cells fight against infection.  Platelets control bleeding.  Plasma helps clot blood.  Other blood products are available for  specialized needs, such as hemophilia or other clotting disorders. BEFORE THE TRANSFUSION  Who gives blood for transfusions?   Healthy volunteers who are fully evaluated to make sure their blood is safe. This is blood bank blood. Transfusion therapy is the safest it has ever been in the practice of medicine. Before blood is taken from a donor, a complete history is taken to make sure that person has no history of diseases nor engages in risky social behavior (examples are intravenous drug use or sexual activity with multiple partners). The donor's travel history is screened to minimize risk of transmitting infections, such as malaria. The donated blood is tested for signs of infectious diseases, such as HIV and hepatitis. The blood is then tested to be sure it is compatible with you in order to minimize the chance of a transfusion reaction. If you or a relative donates blood, this is often done in anticipation of surgery and is not appropriate for emergency situations. It takes many days to process the donated blood. RISKS AND COMPLICATIONS Although transfusion therapy is very safe and saves many lives, the main dangers of transfusion include:   Getting an infectious disease.  Developing a transfusion reaction. This is an allergic reaction to something in the blood you were given. Every precaution is taken to prevent this. The decision to have a blood transfusion has been considered carefully by your caregiver before blood is given. Blood is not given unless the benefits outweigh the risks. AFTER THE TRANSFUSION  Right after receiving a blood transfusion, you will usually feel much better and more energetic. This is especially true if your red blood cells have gotten low (anemic). The transfusion raises the level of the red blood cells which carry oxygen, and this usually causes an energy increase.  The nurse administering the transfusion will monitor you carefully for complications. HOME CARE  INSTRUCTIONS  No special instructions are needed after a transfusion. You may find your energy is better. Speak with your caregiver about any limitations on activity for underlying diseases you may have. SEEK MEDICAL CARE IF:   Your condition is not improving after your transfusion.  You develop redness or irritation at the intravenous (IV) site. SEEK IMMEDIATE MEDICAL CARE IF:  Any of the following symptoms occur over the next 12 hours:  Shaking chills.  You have a temperature by mouth above 102 F (38.9 C), not controlled by medicine.  Chest, back, or muscle pain.  People around you feel you are not acting correctly or are confused.  Shortness of breath or difficulty breathing.  Dizziness and fainting.  You get a rash or develop hives.  You have a decrease in urine output.  Your urine turns a dark color or changes to pink, red,  or brown. Any of the following symptoms occur over the next 10 days:  You have a temperature by mouth above 102 F (38.9 C), not controlled by medicine.  Shortness of breath.  Weakness after normal activity.  The white part of the eye turns yellow (jaundice).  You have a decrease in the amount of urine or are urinating less often.  Your urine turns a dark color or changes to pink, red, or brown. Document Released: 05/07/2000 Document Revised: 08/02/2011 Document Reviewed: 12/25/2007 [facility] Patient Information 2014 [place], [place].  _______________________________________________________________________

## 2019-08-17 ENCOUNTER — Other Ambulatory Visit: Payer: Self-pay

## 2019-08-17 ENCOUNTER — Encounter (HOSPITAL_COMMUNITY)
Admission: RE | Admit: 2019-08-17 | Discharge: 2019-08-17 | Disposition: A | Discharge status: Home / Self Care | Payer: Commercial Managed Care - PPO | Source: Ambulatory Visit | Attending: Gynecologic Oncology | Admitting: Gynecologic Oncology

## 2019-08-17 ENCOUNTER — Encounter (HOSPITAL_COMMUNITY): Admission: RE | Admit: 2019-08-17 | Payer: Commercial Managed Care - PPO | Source: Ambulatory Visit

## 2019-08-17 ENCOUNTER — Encounter (HOSPITAL_COMMUNITY): Payer: Self-pay

## 2019-08-17 DIAGNOSIS — Z01812 Encounter for preprocedural laboratory examination: Secondary | ICD-10-CM | POA: Insufficient documentation

## 2019-08-17 NOTE — Progress Notes (Signed)
PCP - Dr. Urban Gibson Cardiologist - no  Chest x-ray - no EKG - 04/02/19 Stress Test - no ECHO - no Cardiac Cath - no  Sleep Study - no CPAP -   Fasting Blood Sugar - 85-140 Checks Blood Sugar _____ times a day. Every 3 days  Blood Thinner Instructions:NA Aspirin Instructions: Last Dose:  Anesthesia review:   Patient denies shortness of breath, fever, cough and chest pain at PAT appointment yes  Patient verbalized understanding of instructions that were given to them at the PAT appointment. Patient was also instructed that they will need to review over the PAT instructions again at home before surgery. yes

## 2019-08-20 ENCOUNTER — Other Ambulatory Visit: Payer: Self-pay

## 2019-08-20 ENCOUNTER — Encounter (HOSPITAL_COMMUNITY)
Admission: RE | Admit: 2019-08-20 | Discharge: 2019-08-20 | Disposition: A | Discharge status: Home / Self Care | Payer: Commercial Managed Care - PPO | Source: Ambulatory Visit | Attending: Gynecologic Oncology | Admitting: Gynecologic Oncology

## 2019-08-20 DIAGNOSIS — Z01812 Encounter for preprocedural laboratory examination: Secondary | ICD-10-CM | POA: Diagnosis not present

## 2019-08-20 LAB — COMPREHENSIVE METABOLIC PANEL
ALT: 13 U/L (ref 0–44)
AST: 17 U/L (ref 15–41)
Albumin: 3.9 g/dL (ref 3.5–5.0)
Alkaline Phosphatase: 52 U/L (ref 38–126)
Anion gap: 8 (ref 5–15)
BUN: 11 mg/dL (ref 6–20)
CO2: 27 mmol/L (ref 22–32)
Calcium: 9.1 mg/dL (ref 8.9–10.3)
Chloride: 105 mmol/L (ref 98–111)
Creatinine, Ser: 0.73 mg/dL (ref 0.44–1.00)
GFR calc Af Amer: >60 mL/min (ref >60)
GFR calc non Af Amer: >60 mL/min (ref >60)
Glucose, Bld: 108 mg/dL — ABNORMAL HIGH (ref 70–99)
Potassium: 4.1 mmol/L (ref 3.5–5.1)
Sodium: 140 mmol/L (ref 135–145)
Total Bilirubin: 1 mg/dL (ref 0.3–1.2)
Total Protein: 7.6 g/dL (ref 6.5–8.1)

## 2019-08-20 LAB — GLUCOSE, CAPILLARY: Glucose-Capillary: 121 mg/dL — ABNORMAL HIGH (ref 70–99)

## 2019-08-20 LAB — CBC
HCT: 35 % — ABNORMAL LOW (ref 36.0–46.0)
Hemoglobin: 11 g/dL — ABNORMAL LOW (ref 12.0–15.0)
MCH: 23.2 pg — ABNORMAL LOW (ref 26.0–34.0)
MCHC: 31.4 g/dL (ref 30.0–36.0)
MCV: 73.7 fL — ABNORMAL LOW (ref 80.0–100.0)
Platelets: 489 10*3/uL — ABNORMAL HIGH (ref 150–400)
RBC: 4.75 MIL/uL (ref 3.87–5.11)
RDW: 17.6 % — ABNORMAL HIGH (ref 11.5–15.5)
WBC: 7.1 10*3/uL (ref 4.0–10.5)
nRBC: 0 % (ref 0.0–0.2)

## 2019-08-20 LAB — HEMOGLOBIN A1C
Hgb A1c MFr Bld: 7.3 % — ABNORMAL HIGH (ref 4.8–5.6)
Mean Plasma Glucose: 162.81 mg/dL

## 2019-08-20 LAB — URINALYSIS, ROUTINE W REFLEX MICROSCOPIC
Bilirubin Urine: NEGATIVE
Glucose, UA: NEGATIVE mg/dL
Hgb urine dipstick: NEGATIVE
Ketones, ur: NEGATIVE mg/dL
Nitrite: NEGATIVE
Protein, ur: NEGATIVE mg/dL
Specific Gravity, Urine: 1.012 (ref 1.005–1.030)
pH: 7 (ref 5.0–8.0)

## 2019-08-20 LAB — ABO/RH: ABO/RH(D): O POS

## 2019-08-21 ENCOUNTER — Telehealth: Payer: Self-pay

## 2019-08-21 NOTE — Telephone Encounter (Signed)
Told Ms Bayerl that her HgbA1c was better at 7.3. Ms Brockway denies any urinary symptoms.  Told her the urin analysis done  was contaminated. Reviewed with [name], NP. She does not need to come in for a repeat U/A. Pt verbalized understanding.

## 2019-08-24 ENCOUNTER — Other Ambulatory Visit (HOSPITAL_COMMUNITY)
Admission: RE | Admit: 2019-08-24 | Discharge: 2019-08-24 | Disposition: A | Discharge status: Home / Self Care | Payer: Commercial Managed Care - PPO | Source: Ambulatory Visit | Attending: Gynecologic Oncology | Admitting: Gynecologic Oncology

## 2019-08-24 DIAGNOSIS — Z20822 Contact with and (suspected) exposure to covid-19: Secondary | ICD-10-CM | POA: Diagnosis not present

## 2019-08-24 DIAGNOSIS — Z01812 Encounter for preprocedural laboratory examination: Secondary | ICD-10-CM | POA: Diagnosis present

## 2019-08-24 LAB — SARS CORONAVIRUS 2 (TAT 6-24 HRS): SARS Coronavirus 2: NEGATIVE

## 2019-08-27 ENCOUNTER — Other Ambulatory Visit: Payer: Self-pay | Admitting: Gynecologic Oncology

## 2019-08-27 ENCOUNTER — Telehealth: Payer: Self-pay | Admitting: *Deleted

## 2019-08-27 DIAGNOSIS — D069 Carcinoma in situ of cervix, unspecified: Secondary | ICD-10-CM

## 2019-08-27 MED ORDER — SENNOSIDES-DOCUSATE SODIUM 8.6-50 MG PO TABS: 2.0000 | ORAL_TABLET | Freq: Every day | ORAL | 1 refills | Status: AC

## 2019-08-27 MED ORDER — IBUPROFEN 800 MG PO TABS: 800.0000 mg | ORAL_TABLET | Freq: Three times a day (TID) | ORAL | 1 refills | Status: AC | PRN

## 2019-08-27 MED ORDER — OXYCODONE HCL 5 MG PO TABS: 5.0000 mg | ORAL_TABLET | ORAL | 0 refills | Status: AC | PRN

## 2019-08-27 NOTE — Telephone Encounter (Signed)
Pt returned hone call. Pt needs prescriptions sent to pharmacy again, Heywood Iles NP made aware. Pre-op diet, postop bowel regimen, post op activity, pain control and  reasons to call the doctoer reviewed. Pt voiced understanding.

## 2019-08-27 NOTE — Progress Notes (Signed)
Pre-op meds re-prescribed since patient did not pick up previously before last scheduled surgery.

## 2019-08-27 NOTE — Telephone Encounter (Signed)
Left message for pt to call clinic

## 2019-08-28 ENCOUNTER — Ambulatory Visit (HOSPITAL_COMMUNITY): Payer: Commercial Managed Care - PPO | Admitting: Anesthesiology

## 2019-08-28 ENCOUNTER — Ambulatory Visit (HOSPITAL_COMMUNITY)
Admission: RE | Admit: 2019-08-28 | Discharge: 2019-08-28 | Disposition: A | Discharge status: Home / Self Care | Payer: Commercial Managed Care - PPO | Source: Ambulatory Visit | Attending: Gynecologic Oncology | Admitting: Gynecologic Oncology

## 2019-08-28 ENCOUNTER — Encounter (HOSPITAL_COMMUNITY)
Admission: RE | Disposition: A | Discharge status: Home / Self Care | Payer: Self-pay | Source: Ambulatory Visit | Attending: Gynecologic Oncology

## 2019-08-28 ENCOUNTER — Encounter (HOSPITAL_COMMUNITY): Payer: Self-pay | Admitting: Gynecologic Oncology

## 2019-08-28 DIAGNOSIS — I10 Essential (primary) hypertension: Secondary | ICD-10-CM | POA: Insufficient documentation

## 2019-08-28 DIAGNOSIS — N800 Endometriosis of uterus: Secondary | ICD-10-CM | POA: Insufficient documentation

## 2019-08-28 DIAGNOSIS — E119 Type 2 diabetes mellitus without complications: Secondary | ICD-10-CM | POA: Diagnosis not present

## 2019-08-28 DIAGNOSIS — D069 Carcinoma in situ of cervix, unspecified: Secondary | ICD-10-CM | POA: Diagnosis not present

## 2019-08-28 DIAGNOSIS — Z6826 Body mass index (BMI) 26.0-26.9, adult: Secondary | ICD-10-CM | POA: Diagnosis not present

## 2019-08-28 DIAGNOSIS — D250 Submucous leiomyoma of uterus: Secondary | ICD-10-CM | POA: Diagnosis not present

## 2019-08-28 DIAGNOSIS — E663 Overweight: Secondary | ICD-10-CM | POA: Diagnosis not present

## 2019-08-28 DIAGNOSIS — Z794 Long term (current) use of insulin: Secondary | ICD-10-CM | POA: Diagnosis not present

## 2019-08-28 DIAGNOSIS — D251 Intramural leiomyoma of uterus: Secondary | ICD-10-CM

## 2019-08-28 DIAGNOSIS — D252 Subserosal leiomyoma of uterus: Secondary | ICD-10-CM | POA: Insufficient documentation

## 2019-08-28 DIAGNOSIS — D259 Leiomyoma of uterus, unspecified: Secondary | ICD-10-CM | POA: Diagnosis present

## 2019-08-28 DIAGNOSIS — Z79899 Other long term (current) drug therapy: Secondary | ICD-10-CM | POA: Diagnosis not present

## 2019-08-28 DIAGNOSIS — R590 Localized enlarged lymph nodes: Secondary | ICD-10-CM | POA: Insufficient documentation

## 2019-08-28 HISTORY — PX: ROBOTIC ASSISTED LAPAROSCOPIC HYSTERECTOMY AND SALPINGECTOMY: SHX6379

## 2019-08-28 LAB — GLUCOSE, CAPILLARY
Glucose-Capillary: 142 mg/dL — ABNORMAL HIGH (ref 70–99)
Glucose-Capillary: 122 mg/dL — ABNORMAL HIGH (ref 70–99)

## 2019-08-28 LAB — TYPE AND SCREEN
ABO/RH(D): O POS
Antibody Screen: NEGATIVE

## 2019-08-28 LAB — PREGNANCY, URINE: Preg Test, Ur: NEGATIVE

## 2019-08-28 SURGERY — XI ROBOTIC ASSISTED LAPAROSCOPIC HYSTERECTOMY AND SALPINGECTOMY
Anesthesia: General | Laterality: Bilateral

## 2019-08-28 MED ORDER — MIDAZOLAM HCL 5 MG/5ML IJ SOLN
INTRAMUSCULAR | Status: DC | PRN
  Administered 2019-08-28: 2 mg via INTRAVENOUS

## 2019-08-28 MED ORDER — ONDANSETRON HCL 4 MG/2ML IJ SOLN
INTRAMUSCULAR | Status: DC | PRN
  Administered 2019-08-28: 4 mg via INTRAVENOUS

## 2019-08-28 MED ORDER — OXYCODONE HCL 5 MG PO TABS
5.0000 mg | ORAL_TABLET | Freq: Once | ORAL | Status: AC | PRN
  Administered 2019-08-28: 5 mg via ORAL

## 2019-08-28 MED ORDER — LIDOCAINE 2% (20 MG/ML) 5 ML SYRINGE
INTRAMUSCULAR | Status: DC | PRN
  Administered 2019-08-28: 1.5 mg/kg/h via INTRAVENOUS

## 2019-08-28 MED ORDER — ROCURONIUM BROMIDE 10 MG/ML (PF) SYRINGE
PREFILLED_SYRINGE | INTRAVENOUS | Status: DC | PRN
  Administered 2019-08-28: 60 mg via INTRAVENOUS

## 2019-08-28 MED ORDER — ACETAMINOPHEN 10 MG/ML IV SOLN
1000.0000 mg | Freq: Once | INTRAVENOUS | Status: AC
  Administered 2019-08-28: 1000 mg via INTRAVENOUS

## 2019-08-28 MED ORDER — LACTATED RINGERS IR SOLN
Status: DC | PRN
  Administered 2019-08-28: 1

## 2019-08-28 MED ORDER — STERILE WATER FOR IRRIGATION IR SOLN
Status: DC | PRN
  Administered 2019-08-28: 1000 mL

## 2019-08-28 MED ORDER — ONDANSETRON HCL 4 MG/2ML IJ SOLN
INTRAMUSCULAR | Status: AC
  Filled 2019-08-28: qty 2

## 2019-08-28 MED ORDER — SUGAMMADEX SODIUM 200 MG/2ML IV SOLN
INTRAVENOUS | Status: DC | PRN
  Administered 2019-08-28: 200 mg via INTRAVENOUS

## 2019-08-28 MED ORDER — LABETALOL HCL 5 MG/ML IV SOLN
INTRAVENOUS | Status: AC
  Filled 2019-08-28: qty 4

## 2019-08-28 MED ORDER — BUPIVACAINE LIPOSOME 1.3 % IJ SUSP
20.0000 mL | Freq: Once | INTRAMUSCULAR | Status: AC
  Administered 2019-08-28: 20 mL
  Filled 2019-08-28: qty 20

## 2019-08-28 MED ORDER — FENTANYL CITRATE (PF) 100 MCG/2ML IJ SOLN
25.0000 ug | INTRAMUSCULAR | Status: DC | PRN
  Administered 2019-08-28: 25 ug via INTRAVENOUS
  Administered 2019-08-28: 50 ug via INTRAVENOUS

## 2019-08-28 MED ORDER — DEXAMETHASONE SODIUM PHOSPHATE 4 MG/ML IJ SOLN
INTRAMUSCULAR | Status: DC | PRN
  Administered 2019-08-28: 8 mg via INTRAVENOUS

## 2019-08-28 MED ORDER — ACETAMINOPHEN 500 MG PO TABS
1000.0000 mg | ORAL_TABLET | ORAL | Status: DC
  Filled 2019-08-28: qty 2

## 2019-08-28 MED ORDER — ONDANSETRON HCL 4 MG/2ML IJ SOLN: 4.0000 mg | Freq: Once | INTRAMUSCULAR | Status: DC | PRN

## 2019-08-28 MED ORDER — KETOROLAC TROMETHAMINE 30 MG/ML IJ SOLN
30.0000 mg | Freq: Once | INTRAMUSCULAR | Status: AC
  Administered 2019-08-28: 30 mg via INTRAVENOUS

## 2019-08-28 MED ORDER — LIDOCAINE 2% (20 MG/ML) 5 ML SYRINGE
INTRAMUSCULAR | Status: AC
  Filled 2019-08-28: qty 5

## 2019-08-28 MED ORDER — ROCURONIUM BROMIDE 10 MG/ML (PF) SYRINGE
PREFILLED_SYRINGE | INTRAVENOUS | Status: AC
  Filled 2019-08-28: qty 10

## 2019-08-28 MED ORDER — FENTANYL CITRATE (PF) 250 MCG/5ML IJ SOLN
INTRAMUSCULAR | Status: AC
  Filled 2019-08-28: qty 5

## 2019-08-28 MED ORDER — CELECOXIB 200 MG PO CAPS
400.0000 mg | ORAL_CAPSULE | ORAL | Status: DC
  Filled 2019-08-28: qty 2

## 2019-08-28 MED ORDER — PROPOFOL 10 MG/ML IV BOLUS
INTRAVENOUS | Status: DC | PRN
  Administered 2019-08-28: 200 mg via INTRAVENOUS

## 2019-08-28 MED ORDER — SCOPOLAMINE 1 MG/3DAYS TD PT72
1.0000 | MEDICATED_PATCH | TRANSDERMAL | Status: DC
  Administered 2019-08-28: 1.5 mg via TRANSDERMAL
  Filled 2019-08-28: qty 1

## 2019-08-28 MED ORDER — KETAMINE HCL 10 MG/ML IJ SOLN
INTRAMUSCULAR | Status: DC | PRN
  Administered 2019-08-28: 40 mg via INTRAVENOUS

## 2019-08-28 MED ORDER — GABAPENTIN 300 MG PO CAPS
300.0000 mg | ORAL_CAPSULE | ORAL | Status: DC
  Filled 2019-08-28: qty 1

## 2019-08-28 MED ORDER — DEXAMETHASONE SODIUM PHOSPHATE 4 MG/ML IJ SOLN: 4.0000 mg | INTRAMUSCULAR | Status: DC

## 2019-08-28 MED ORDER — LABETALOL HCL 5 MG/ML IV SOLN
INTRAVENOUS | Status: DC | PRN
  Administered 2019-08-28: 5 mg via INTRAVENOUS

## 2019-08-28 MED ORDER — BUPIVACAINE HCL 0.25 % IJ SOLN
INTRAMUSCULAR | Status: DC | PRN
  Administered 2019-08-28: 30 mL

## 2019-08-28 MED ORDER — OXYCODONE HCL 5 MG/5ML PO SOLN: 5.0000 mg | Freq: Once | ORAL | Status: AC | PRN

## 2019-08-28 MED ORDER — MIDAZOLAM HCL 2 MG/2ML IJ SOLN
INTRAMUSCULAR | Status: AC
  Filled 2019-08-28: qty 2

## 2019-08-28 MED ORDER — SODIUM CHLORIDE 0.9% FLUSH: 3.0000 mL | Freq: Two times a day (BID) | INTRAVENOUS | Status: DC

## 2019-08-28 MED ORDER — LIDOCAINE 2% (20 MG/ML) 5 ML SYRINGE
INTRAMUSCULAR | Status: DC | PRN
  Administered 2019-08-28: 100 mg via INTRAVENOUS

## 2019-08-28 MED ORDER — OXYCODONE HCL 5 MG PO TABS
ORAL_TABLET | ORAL | Status: AC
  Filled 2019-08-28: qty 1

## 2019-08-28 MED ORDER — DEXAMETHASONE SODIUM PHOSPHATE 10 MG/ML IJ SOLN
INTRAMUSCULAR | Status: AC
  Filled 2019-08-28: qty 1

## 2019-08-28 MED ORDER — BUPIVACAINE HCL 0.25 % IJ SOLN
INTRAMUSCULAR | Status: AC
  Filled 2019-08-28: qty 1

## 2019-08-28 MED ORDER — CEFAZOLIN SODIUM-DEXTROSE 2-4 GM/100ML-% IV SOLN
2.0000 g | INTRAVENOUS | Status: AC
  Administered 2019-08-28: 11:00:00 2 g via INTRAVENOUS
  Filled 2019-08-28: qty 100

## 2019-08-28 MED ORDER — LACTATED RINGERS IV SOLN: INTRAVENOUS | Status: DC

## 2019-08-28 MED ORDER — ACETAMINOPHEN 10 MG/ML IV SOLN
INTRAVENOUS | Status: AC
  Filled 2019-08-28: qty 100

## 2019-08-28 MED ORDER — FENTANYL CITRATE (PF) 100 MCG/2ML IJ SOLN
INTRAMUSCULAR | Status: DC | PRN
  Administered 2019-08-28 (×3): 50 ug via INTRAVENOUS
  Administered 2019-08-28: 100 ug via INTRAVENOUS

## 2019-08-28 MED ORDER — KETOROLAC TROMETHAMINE 30 MG/ML IJ SOLN
INTRAMUSCULAR | Status: AC
  Filled 2019-08-28: qty 1

## 2019-08-28 MED ORDER — FENTANYL CITRATE (PF) 100 MCG/2ML IJ SOLN
INTRAMUSCULAR | Status: AC
  Filled 2019-08-28: qty 2

## 2019-08-28 MED ORDER — LIDOCAINE 2% (20 MG/ML) 5 ML SYRINGE
INTRAMUSCULAR | Status: AC
  Filled 2019-08-28: qty 10

## 2019-08-28 SURGICAL SUPPLY — 69 items
APPLICATOR SURGIFLO ENDO (HEMOSTASIS) IMPLANT
BACTOSHIELD CHG 4% 4OZ (MISCELLANEOUS) ×1
BAG LAPAROSCOPIC 12 15 PORT 16 (BASKET) IMPLANT
BAG RETRIEVAL 12/15 (BASKET) ×2
BLADE SURG SZ10 CARB STEEL (BLADE) IMPLANT
COVER BACK TABLE 60X90IN (DRAPES) ×2 IMPLANT
COVER TIP SHEARS 8 DVNC (MISCELLANEOUS) ×1 IMPLANT
COVER TIP SHEARS 8MM DA VINCI (MISCELLANEOUS) ×2
COVER WAND RF STERILE (DRAPES) IMPLANT
DECANTER SPIKE VIAL GLASS SM (MISCELLANEOUS) IMPLANT
DERMABOND ADVANCED (GAUZE/BANDAGES/DRESSINGS) ×1
DERMABOND ADVANCED .7 DNX12 (GAUZE/BANDAGES/DRESSINGS) ×1 IMPLANT
DRAPE ARM DVNC X/XI (DISPOSABLE) ×4 IMPLANT
DRAPE COLUMN DVNC XI (DISPOSABLE) ×1 IMPLANT
DRAPE DA VINCI XI ARM (DISPOSABLE) ×8
DRAPE DA VINCI XI COLUMN (DISPOSABLE) ×2
DRAPE SHEET LG 3/4 BI-LAMINATE (DRAPES) ×2 IMPLANT
DRAPE SURG IRRIG POUCH 19X23 (DRAPES) ×2 IMPLANT
DRSG OPSITE POSTOP 3X4 (GAUZE/BANDAGES/DRESSINGS) ×1 IMPLANT
DRSG OPSITE POSTOP 4X6 (GAUZE/BANDAGES/DRESSINGS) IMPLANT
DRSG OPSITE POSTOP 4X8 (GAUZE/BANDAGES/DRESSINGS) IMPLANT
ELECT PENCIL ROCKER SW 15FT (MISCELLANEOUS) ×1 IMPLANT
ELECT REM PT RETURN 15FT ADLT (MISCELLANEOUS) ×2 IMPLANT
GLOVE BIO SURGEON STRL SZ 6 (GLOVE) ×8 IMPLANT
GLOVE BIO SURGEON STRL SZ 6.5 (GLOVE) ×1 IMPLANT
GOWN STRL REUS W/ TWL LRG LVL3 (GOWN DISPOSABLE) ×4 IMPLANT
GOWN STRL REUS W/TWL LRG LVL3 (GOWN DISPOSABLE) ×8
HOLDER FOLEY CATH W/STRAP (MISCELLANEOUS) ×2 IMPLANT
IRRIG SUCT STRYKERFLOW 2 WTIP (MISCELLANEOUS) ×2
IRRIGATION SUCT STRKRFLW 2 WTP (MISCELLANEOUS) ×1 IMPLANT
KIT PROCEDURE DA VINCI SI (MISCELLANEOUS)
KIT PROCEDURE DVNC SI (MISCELLANEOUS) IMPLANT
KIT TURNOVER KIT A (KITS) IMPLANT
MANIPULATOR UTERINE 4.5 ZUMI (MISCELLANEOUS) ×2 IMPLANT
NDL SPNL 18GX3.5 QUINCKE PK (NEEDLE) IMPLANT
NEEDLE HYPO 22GX1.5 SAFETY (NEEDLE) IMPLANT
NEEDLE SPNL 18GX3.5 QUINCKE PK (NEEDLE) IMPLANT
OBTURATOR OPTICAL STANDARD 8MM (TROCAR) ×2
OBTURATOR OPTICAL STND 8 DVNC (TROCAR) ×1
OBTURATOR OPTICALSTD 8 DVNC (TROCAR) ×1 IMPLANT
PACK ROBOT GYN CUSTOM WL (TRAY / TRAY PROCEDURE) ×2 IMPLANT
PAD POSITIONING PINK XL (MISCELLANEOUS) ×2 IMPLANT
PENCIL SMOKE EVACUATOR (MISCELLANEOUS) IMPLANT
PORT ACCESS TROCAR AIRSEAL 12 (TROCAR) ×1 IMPLANT
PORT ACCESS TROCAR AIRSEAL 5M (TROCAR) ×1
POUCH SPECIMEN RETRIEVAL 10MM (ENDOMECHANICALS) IMPLANT
SCRUB CHG 4% DYNA-HEX 4OZ (MISCELLANEOUS) ×1 IMPLANT
SEAL CANN UNIV 5-8 DVNC XI (MISCELLANEOUS) ×3 IMPLANT
SEAL XI 5MM-8MM UNIVERSAL (MISCELLANEOUS) ×6
SET TRI-LUMEN FLTR TB AIRSEAL (TUBING) ×2 IMPLANT
SPONGE LAP 18X18 RF (DISPOSABLE) ×1 IMPLANT
SURGIFLO W/THROMBIN 8M KIT (HEMOSTASIS) IMPLANT
SUT MNCRL AB 4-0 PS2 18 (SUTURE) ×1 IMPLANT
SUT PDS AB 1 TP1 96 (SUTURE) IMPLANT
SUT VIC AB 0 CT1 27 (SUTURE) ×6
SUT VIC AB 0 CT1 27XBRD ANTBC (SUTURE) IMPLANT
SUT VIC AB 2-0 CT1 27 (SUTURE)
SUT VIC AB 2-0 CT1 TAPERPNT 27 (SUTURE) IMPLANT
SUT VIC AB 4-0 PS2 18 (SUTURE) ×2 IMPLANT
SUT VICRYL 4-0 PS2 18IN ABS (SUTURE) ×4 IMPLANT
SYR 10ML LL (SYRINGE) IMPLANT
TOWEL OR NON WOVEN STRL DISP B (DISPOSABLE) ×2 IMPLANT
TRAP SPECIMEN MUCOUS 40CC (MISCELLANEOUS) IMPLANT
TRAY FOLEY MTR SLVR 16FR STAT (SET/KITS/TRAYS/PACK) ×2 IMPLANT
TROCAR XCEL NON-BLD 5MMX100MML (ENDOMECHANICALS) IMPLANT
UNDERPAD 30X36 HEAVY ABSORB (UNDERPADS AND DIAPERS) ×2 IMPLANT
WATER STERILE IRR 1000ML POUR (IV SOLUTION) ×2 IMPLANT
YANKAUER SUCT BULB TIP 10FT TU (MISCELLANEOUS) IMPLANT
YANKAUER SUCT BULB TIP NO VENT (SUCTIONS) ×1 IMPLANT

## 2019-08-28 NOTE — H&P (Signed)
H&P Note: Gyn-Onc  Consult was initially requested by Dr. Wilhelmenia Blase for the evaluation of Ebony Holland 54 y.o. female  CC:  Uterine fibroids, CIN 3.  Assessment/Plan:  Ebony Holland  is a 54 y.o.  year old with CIN III and symptomatic uterine fibroids.  CKC showed CIN 3 with negative margins (CIN 1 at the ectocervical margin).  The patient is interested in definitive surgery for her fibroids with hysterectomy and bilateral salpingectomy. I believe that we can attempt this robotically, but may required a minilaparotomy for specimen retrieval. I explained that this will not eliminate her need for annual paps to monitor for vaginal dysplasia. I explained that she has a 20% risk for vaginal dysplasia after hysterectomy which would require surgical or medical intervention, and carries a persistent risk for vaginal cancer. Therefore, in other words, the hysterectomy is treatment for her fibroids, but not her cervical dysplasia.  We discussed the procedure, its risks including  bleeding, infection, damage to internal organs (such as bladder,ureters, bowels), blood clot, reoperation and rehospitalization. We discussed anticipated recovery and restrictions.   She has a lot of pelvic floor spasm causing discomfort and dyspareunia. I explained that I didn't feel this would be helped by surgery, and if it is still present postop, she will need to be referred to a pelvic pain specialist for non-surgical treatment of this.  Surgery is scheduled for February 9th, 2021. We will check HbA1c preop. If it is >8% we will postpone her surgery.   HPI: Ebony Holland is a 54 year old P4 who was seen in consultation at the request of [place] for evaluation of CIN-3 and symptomatic uterine fibroids.  The patient reported having an abnormal Pap test during her work-up for abnormal uterine bleeding.  This was performed in February 06, 2019 and revealed H SIL with negative high-risk HPV.  Trichomonas was  also present.  She reported that 3 years prior to this Pap test she also had an abnormal Pap test that did not require intervention.  Following her H SIL Pap she then proceeded to have a colposcopic evaluation of the cervix on February 15, 2019.  The colposcopic exam was unsatisfactory.  The entire transformation zone was not visualized.  There were acetowhite changes at 11:00.  Biopsies were taken from the ectocervix which revealed benign cervical mucosa and from the endocervix a curettage was performed which showed CIN-2 to 3.  With respect to her fibroid symptoms she reports menorrhagia menorrhagia for approximately 5 to 6 months.  She is never required a blood transfusion.  She reports increasing pelvic pain and intolerance with pelvic examinations.  Ultrasound dimensions of the uterus on February 15, 2019 revealed a uterus measuring 11.15 x 8.46 x 4.71 cm with an endometrial thickness of 6.8 mm.  The left and right ovaries were all less than 3 cm and normal.  The largest fibroid measured 6.8 cm.  It was subserosal and extending off the fundus.  Otherwise her medical history is remarkable for hypertension and diabetes mellitus.  Her diabetes is type II.  She no longer takes insulin.  She reports that in October 2020 her hemoglobin A1c was 7.1.  She is overweight with a BMI of 25 but no diabetes.  She is a 1 private cesarean section and 3 prior vaginal births but denies other abdominal surgeries and has never had a myomectomy.  Interval Hx:  On April 02, 2019 she underwent cold knife conization of the cervix. Intraoperative findings were significant for  no gross ectocervical disease. Surgery was uncomplicated.  Final pathology revealed CIN-3 with extensive involvement of endocervical glands.  The margins of the specimen were negative for high-grade dysplasia including the post cone ECC.  However CIN-1 involve the ectocervical margin.  Since surgery she did well healing from her  current.  She developed pyelonephritis.  Her blood glucose was poorly controlled with a hemoglobin A1c of 9.2% in October 2020.  She had a CT scan of the abdomen pelvis which confirmed pyelonephritis and a bulky fibroid uterus.  She was started on an insulin regimen and has much better blood glucose control.  Repeat HbA1c on 08/20/19 was improved at 7.3%.  Current Meds:  Outpatient Encounter Medications as of 04/23/2019  Medication Sig  . cholecalciferol (VITAMIN D3) 25 MCG (1000 UT) tablet Take 1,000 Units by mouth daily.  Marland Kitchen glipiZIDE (GLUCOTROL) 5 MG tablet Take by mouth 2 (two) times daily.  Marland Kitchen ibuprofen (ADVIL) 800 MG tablet Take 1 tablet (800 mg total) by mouth every 8 (eight) hours as needed.  Marland Kitchen lisinopril (ZESTRIL) 40 MG tablet Take 40 mg by mouth daily.  . Multiple Vitamin (MULTIVITAMIN) capsule Take 1 capsule by mouth daily.  . pioglitazone (ACTOS) 30 MG tablet Take 30 mg by mouth daily.  . [DISCONTINUED] oxyCODONE (OXY IR/ROXICODONE) 5 MG immediate release tablet Take 1 tablet (5 mg total) by mouth every 4 (four) hours as needed for severe pain.  . [DISCONTINUED] senna (SENOKOT) 8.6 MG TABS tablet Take 1 tablet (8.6 mg total) by mouth at bedtime.   No facility-administered encounter medications on file as of 04/23/2019.     Allergy: No Known Allergies  Social Hx:   Social History   Socioeconomic History  . Marital status: Legally Separated    Spouse name: Not on file  . Number of children: Not on file  . Years of education: Not on file  . Highest education level: Not on file  Occupational History  . Not on file  Tobacco Use  . Smoking status: Never Smoker  . Smokeless tobacco: Never Used  Substance and Sexual Activity  . Alcohol use: Not Currently  . Drug use: Never  . Sexual activity: Not on file  Other Topics Concern  . Not on file  Social History Narrative  . Not on file   Social Determinants of Health   Financial Resource Strain:   . Difficulty of Paying  Living Expenses:   Food Insecurity:   . Worried About Charity fundraiser in the Last Year:   . Arboriculturist in the Last Year:   Transportation Needs:   . Film/video editor (Medical):   Marland Kitchen Lack of Transportation (Non-Medical):   Physical Activity:   . Days of Exercise per Week:   . Minutes of Exercise per Session:   Stress:   . Feeling of Stress :   Social Connections:   . Frequency of Communication with Friends and Family:   . Frequency of Social Gatherings with Friends and Family:   . Attends Religious Services:   . Active Member of Clubs or Organizations:   . Attends Archivist Meetings:   Marland Kitchen Marital Status:   Intimate Partner Violence:   . Fear of Current or Ex-Partner:   . Emotionally Abused:   Marland Kitchen Physically Abused:   . Sexually Abused:     Past Surgical Hx:  Past Surgical History:  Procedure Laterality Date  . CERVICAL CONIZATION W/BX N/A 04/02/2019   Procedure: CONIZATION CERVIX WITH  BIOPSY;  Surgeon: Everitt Amber, MD;  Location: [facility];  Service: Gynecology;  Laterality: N/A;  . CESAREAN SECTION N/A 05/25/1991  . TUBAL LIGATION Bilateral 1995    Past Medical Hx:  Past Medical History:  Diagnosis Date  . Breast lump    left  . CIN III (cervical intraepithelial neoplasia III)   . History of pyelonephritis    03-23-2019  . Hypertension    followed by pcp  (03-30-2019  per pt never had a stress test)  . Mixed dyslipidemia   . Type 2 diabetes mellitus [facility])    endocrinologist--- dr doer ([facility] in [facility])----  checks cbg in morning,  fasting cbg-- 153  . Uterine fibroid   . Wears glasses     Past Gynecological History:  No bleeding Patient's last menstrual period was 07/31/2019.  Family Hx:  Family History  Problem Relation Age of Onset  . Breast cancer Mother     Review of Systems:  Constitutional  Feels well,    ENT Normal appearing ears and nares bilaterally Skin/Breast  No rash, sores, jaundice, itching,  dryness Cardiovascular  No chest pain, shortness of breath, or edema  Pulmonary  No cough or wheeze.  Gastro Intestinal  No nausea, vomitting, or diarrhoea. No bright red blood per rectum, no abdominal pain, change in bowel movement, or constipation.  Genito Urinary  No frequency, urgency, dysuria, + abnormal vaginal bleeding, + pelvic pains Musculo Skeletal  No myalgia, arthralgia, joint swelling or pain  Neurologic  No weakness, numbness, change in gait,  Psychology  No depression, anxiety, insomnia.   Vitals:  Blood pressure 140/75, pulse (!) 102, temperature 98.2 F (36.8 C), temperature source Oral, resp. rate 16, last menstrual period 07/31/2019, SpO2 100 %.  Physical Exam: WD in NAD Neck  Supple NROM, without any enlargements.  Lymph Node Survey No cervical supraclavicular or inguinal adenopathy Cardiovascular  Pulse normal rate, regularity and rhythm. S1 and S2 normal.  Lungs  Clear to auscultation bilateraly, without wheezes/crackles/rhonchi. Good air movement.  Skin  No rash/lesions/breakdown  Psychiatry  Alert and oriented to person, place, and time  Abdomen  Normoactive bowel sounds, abdomen soft, non-tender and nonobese without evidence of hernia. Back No CVA tenderness Genito Urinary  Vulva/vagina: Normal external female genitalia.  No lesions. No discharge or bleeding.  Bladder/urethra:  No lesions or masses, well supported bladder  Vagina: normal in appearance, tender at introitus and pelvic floor  Cervix: healing well. Normal cone base.   Uterus: Bulky, 14+ cm, mobile, no parametrial involvement or nodularity. Exam limited secondary to patient intolerance to exam.  Adnexa: no palpable masses. Rectal  deferred Extremities  No bilateral cyanosis, clubbing or edema.   Thereasa Solo, MD  08/28/2019, 9:54 AM

## 2019-08-28 NOTE — Anesthesia Preprocedure Evaluation (Signed)
Anesthesia Evaluation  Patient identified by MRN, date of birth, ID band Patient awake    Reviewed: Allergy & Precautions, NPO status , Patient's Chart, lab work & pertinent test results  History of Anesthesia Complications Negative for: history of anesthetic complications  Airway Mallampati: II  TM Distance: >3 FB Neck ROM: Full    Dental   Pulmonary neg pulmonary ROS,    Pulmonary exam normal        Cardiovascular hypertension, Pt. on medications Normal cardiovascular exam     Neuro/Psych negative neurological ROS  negative psych ROS   GI/Hepatic negative GI ROS, Neg liver ROS,   Endo/Other  diabetes, Type 2, Oral Hypoglycemic Agents, Insulin Dependent  Renal/GU negative Renal ROS  negative genitourinary   Musculoskeletal negative musculoskeletal ROS (+)   Abdominal   Peds  Hematology  (+) anemia ,   Anesthesia Other Findings  Hgb 11.0  Reproductive/Obstetrics Fibroid uterus, CIN 3                             Anesthesia Physical Anesthesia Plan  ASA: II  Anesthesia Plan: General   Post-op Pain Management:    Induction: Intravenous  PONV Risk Score and Plan: 3 and Ondansetron, Dexamethasone, Treatment may vary due to age or medical condition and Midazolam  Airway Management Planned: Oral ETT  Additional Equipment: None  Intra-op Plan:   Post-operative Plan: Extubation in OR  Informed Consent: I have reviewed the patients History and Physical, chart, labs and discussed the procedure including the risks, benefits and alternatives for the proposed anesthesia with the patient or authorized representative who has indicated his/her understanding and acceptance.     Dental advisory given  Plan Discussed with:   Anesthesia Plan Comments:         Anesthesia Quick Evaluation

## 2019-08-28 NOTE — Transfer of Care (Signed)
Immediate Anesthesia Transfer of Care Note  Patient: Ebony Holland  Procedure(s) Performed: XI ROBOTIC ASSISTED LAPAROSCOPIC HYSTERECTOMY AND SALPINGECTOMY GRATER THAN 250 GRAMS, MINI LAPAROTOMY (Bilateral )  Patient Location: PACU  Anesthesia Type:General  Level of Consciousness: awake  Airway & Oxygen Therapy: Patient Spontanous Breathing and Patient connected to face mask oxygen  Post-op Assessment: Report given to RN and Post -op Vital signs reviewed and stable  Post vital signs: Reviewed and stable  Last Vitals:  Vitals Value Taken Time  BP 185/94 08/28/19 1317  Temp    Pulse 84 08/28/19 1320  Resp 10 08/28/19 1320  SpO2 100 % 08/28/19 1320  Vitals shown include unvalidated device data.  Last Pain:  Vitals:   08/28/19 0952  TempSrc: Oral         Complications: No apparent anesthesia complications

## 2019-08-28 NOTE — Anesthesia Procedure Notes (Signed)
Procedure Name: Intubation Date/Time: 08/28/2019 10:49 AM Performed by: Lieutenant Diego, CRNA Pre-anesthesia Checklist: Patient identified, Emergency Drugs available, Suction available and Patient being monitored Patient Re-evaluated:Patient Re-evaluated prior to induction Oxygen Delivery Method: Circle system utilized Preoxygenation: Pre-oxygenation with 100% oxygen Induction Type: IV induction Ventilation: Mask ventilation without difficulty Laryngoscope Size: Miller and 2 Grade View: Grade I Tube type: Oral Tube size: 7.0 mm Number of attempts: 1 Airway Equipment and Method: Stylet Placement Confirmation: ETT inserted through vocal cords under direct vision,  positive ETCO2 and breath sounds checked- equal and bilateral Secured at: 21 cm Tube secured with: Tape Dental Injury: Teeth and Oropharynx as per pre-operative assessment

## 2019-08-28 NOTE — Discharge Instructions (Signed)
Constitutional: negative for fatigue and weight loss Respiratory: negative for cough and wheezing Cardiovascular: negative for chest pain, fatigue and palpitations Gastrointestinal: negative for abdominal pain and change in bowel habits Genitourinary:negative Integument/breast: negative for nipple discharge Musculoskeletal:negative for myalgias Neurological: negative for gait problems and tremors Behavioral/Psych: negative for abusive relationship, depression Endocrine: negative for temperature intolerance     Return to work: 4 weeks (2 weeks with physical restrictions).  Activity: 1. Be up and out of the bed during the day.  Take a nap if needed.  You may walk up steps but be careful and use the hand rail.  Stair climbing will tire you more than you think, you may need to stop part way and rest.   2. No lifting or straining for 4 weeks.  3. No driving for 1 weeks.  Do Not drive if you are taking narcotic pain medicine.  4. Shower daily.  Use soap and water on your incision and pat dry; don't rub.   5. No sexual activity and nothing in the vagina for 8 weeks.  Medications:  - Take ibuprofen and tylenol first line for pain control. Take these regularly (every 6 hours) to decrease the build up of pain.  - If necessary, for severe pain not relieved by ibuprofen, contact Dr Serita Grit office and you will be prescribed percocet.  - While taking percocet you should take sennakot every night to reduce the likelihood of constipation. If this causes diarrhea, stop its use.  Diet: 1. Low sodium Heart Healthy Diet is recommended.  2. It is safe to use a laxative if you have difficulty moving your bowels.   Wound Care: 1. Keep clean and dry.  Shower daily. 2. You can get the dressing wet in the shower, but avoid tub baths. 3. Remove the dressing between 5 and 7 days after your surgery (1 week after your operation). 4. After the dressing is removed you can get the incision wet in the shower,  however continue to avoid tub baths until advised otherwise by your surgeon at follow-up.   Reasons to call the Doctor:   Fever - Oral temperature greater than 100.4 degrees Fahrenheit  Foul-smelling vaginal discharge  Difficulty urinating  Nausea and vomiting  Increased pain at the site of the incision that is unrelieved with pain medicine.  Difficulty breathing with or without chest pain  New calf pain especially if only on one side  Sudden, continuing increased vaginal bleeding with or without clots.   Follow-up: 1. See Everitt Amber in 4 weeks.  Contacts: For questions or concerns you should contact:  Dr. Everitt Amber at [phone] After hours and on week-ends call [phone] and ask to speak to the physician on call for Gynecologic Oncology  After Your Surgery  The information in this section will tell you what to expect after your surgery, both during your stay and after you leave. You will learn how to safely recover from your surgery. Write down any questions you have and be sure to ask your doctor or nurse.  What to Expect When you wake up after your surgery, you will be in the [facility] ([facility]U) or your recovery room. A nurse will be monitoring your body temperature, blood pressure, pulse, and oxygen levels. You may have a urinary catheter in your bladder to help monitor the amount of urine you are making. It should come out before you go home. You will also have compression boots on your lower legs to help  your circulation. Your pain medication will be given through an IV line or in tablet form. If you are having pain, tell your nurse. Your nurse will tell you how to recover from your surgery. Below are examples of ways you can help yourself recover safely. . You will be encouraged to walk with the help of your nurse or physical therapist. We will give you medication to relieve pain. Walking helps reduce the risk for blood clots and pneumonia. It also  helps to stimulate your bowels so they begin working again. . Use your incentive spirometer. This will help your lungs expand, which prevents pneumonia.   Commonly Asked Questions  Will I have pain after surgery? Yes, you will have some pain after your surgery, especially in the first few days. Your doctor and nurse will ask you about your pain often. You will be given medication to manage your pain as needed. If your pain is not relieved, please tell your doctor or nurse. It is important to control your pain so you can cough, breathe deeply, use your incentive spirometer, and get out of bed and walk.  Will I be able to eat? Yes, you will be able to eat a regular diet or eat as tolerated. You should start with foods that are soft and easy to digest such as apple sauce and chicken noodle soup. Eat small meals frequently, and then advance to regular foods. If you experience bloating, gas, or cramps, limit high-fiber foods, including whole grain breads and cereal, nuts, seeds, salads, fresh fruit, broccoli, cabbage, and cauliflower. Will I have pain when I am home? The length of time each person has pain or discomfort varies. You may still have some pain when you go home and will probably be taking pain medication. Follow the guidelines below. . Take your medications as directed and as needed. . Call your doctor if the medication prescribed for you doesn't relieve your pain. . Don't drive or drink alcohol while you're taking prescription pain medication. . As your incision heals, you will have less pain and need less pain medication. A mild pain reliever such as acetaminophen (Tylenol) or ibuprofen (Advil) will relieve aches and discomfort. However, large quantities of acetaminophen may be harmful to your liver. Don't take more acetaminophen than the amount directed on the bottle or as instructed by your doctor or nurse. . Pain medication should help you as you resume your normal activities. Take  enough medication to do your exercises comfortably. Pain medication is most effective 30 to 45 minutes after taking it. Marland Kitchen Keep track of when you take your pain medication. Taking it when your pain first begins is more effective than waiting for the pain to get worse. Pain medication may cause constipation (having fewer bowel movements than what is normal for you).  How can I prevent constipation? . Go to the bathroom at the same time every day. Your body will get used to going at that time. . If you feel the urge to go, don't put it off. Try to use the bathroom 5 to 15 minutes after meals. . After breakfast is a Graylen Noboa time to move your bowels. The reflexes in your colon are strongest at this time. . Exercise, if you can. Walking is an excellent form of exercise. . Drink 8 (8-ounce) glasses (2 liters) of liquids daily, if you can. Drink water, juices, soups, ice cream shakes, and other drinks that don't have caffeine. Drinks with caffeine, such as coffee and soda, pull fluid  out of the body. . Slowly increase the fiber in your diet to 25 to 35 grams per day. Fruits, vegetables, whole grains, and cereals contain fiber. If you have an ostomy or have had recent bowel surgery, check with your doctor or nurse before making any changes in your diet. . Both over-the-counter and prescription medications are available to treat constipation. Start with 1 of the following over-the-counter medications first: o Docusate sodium (Colace) 100 mg. Take ___1__ capsules _2____ times a day. This is a stool softener that causes few side effects. Don't take it with mineral oil. o Polyethylene glycol (MiraLAX) 17 grams daily. o Senna (Senokot) 2 tablets at bedtime. This is a stimulant laxative, which can cause cramping. . If you haven't had a bowel movement in 2 days, call your doctor or nurse.  Can I shower? Yes, you should shower 24 hours after your surgery. Be sure to shower every day. Taking a warm shower is  relaxing and can help decrease muscle aches. Use soap when you shower and gently wash your incision. Pat the areas dry with a towel after showering, and leave your incision uncovered (unless there is drainage). Call your doctor if you see any redness or drainage from your incision. Don't take tub baths until you discuss it with your doctor at the first appointment after your surgery. How do I care for my incisions? You will have several small incisions on your abdomen. The incisions are closed with Steri-Strips or Dermabond. You may also have square white dressings on your incisions ([place]). You can remove these in the shower 24 hours after your surgery. You should clean your incisions with soap and water. If you go home with Steri-Strips on your incision, they will loosen and may fall off by themselves. If they haven't fallen off within 10 days, you can remove them. If you go home with Dermabond over your sutures (stitches), it will also loosen and peel off.  What are the most common symptoms after a hysterectomy? It's common for you to have some vaginal spotting or light bleeding. You should monitor this with a pad or a panty liner. If you have having heavy bleeding (bleeding through a pad or liner every 1 to 2 hours), call your doctor right away. It's also common to have some discomfort after surgery from the air that was pumped into your abdomen during surgery. To help with this, walk, drink plenty of liquids and make sure to take the stool softeners you received.  When is it safe for me to drive? You may resume driving 2 weeks after surgery, as long as you are not taking pain medication that may make you drowsy.  When can I resume sexual activity? Do not place anything in your vagina or have vaginal intercourse for 8 weeks after your surgery. Some people will need to wait longer than 8 weeks, so speak with your doctor before resuming sexual intercourse.  Will I be able to travel? Yes, you  can travel. If you are traveling by plane within a few weeks after your surgery, make sure you get up and walk every hour. Be sure to stretch your legs, drink plenty of liquids, and keep your feet elevated when possible.  Will I need any supplies? Most people do not need any supplies after the surgery. In the rare case that you do need supplies, such as tubes or drains, your nurse will order them for you.  When can I return to work? The time it takes  to return to work depends on the type of work you do, the type of surgery you had, and how fast your body heals. Most people can return to work about 2 to 4 weeks after the surgery.  What exercises can I do? Exercise will help you gain strength and feel better. Walking and stair climbing are excellent forms of exercise. Gradually increase the distance you walk. Climb stairs slowly, resting or stopping as needed. Ask your doctor or nurse before starting more strenuous exercises.  When can I lift heavy objects? Most people should not lift anything heavier than 10 pounds (4.5 kilograms) for at least 4 weeks after surgery. Speak with your doctor about when you can do heavy lifting.  How can I cope with my feelings? After surgery for a serious illness, you may have new and upsetting feelings. Many people say they felt weepy, sad, worried, nervous, irritable, and angry at one time or another. You may find that you can't control some of these feelings. If this happens, it's a Keishawn Rajewski idea to seek emotional support. The first step in coping is to talk about how you feel. Family and friends can help. Your nurse, doctor, and social worker can reassure, support, and guide you. It's always a Kieth Hartis idea to let these professionals know how you, your family, and your friends are feeling emotionally. Many resources are available to patients and their families. Whether you're in the hospital or at home, the nurses, doctors, and social workers are here to help you and your  family and friends handle the emotional aspects of your illness.  When is my first appointment after surgery? Your first appointment after surgery will be 2 to 4 weeks after surgery. Your nurse will give you instructions on how to make this appointment, including the phone number to call.  What if I have other questions? If you have any questions or concerns, please talk with your doctor or nurse. You can reach them Monday through Friday from 9:00 am to 5:00 pm. After 5:00 pm, during the weekend, and on holidays, call [phone] and ask for the doctor on call for your doctor.  . Have a temperature of 101 F (38.3 C) or higher . Have pain that does not get better with pain medication . Have redness, drainage, or swelling from your incisions   General Anesthesia, Adult, Care After This sheet gives you information about how to care for yourself after your procedure. Your health care provider may also give you more specific instructions. If you have problems or questions, contact your health care provider. What can I expect after the procedure? After the procedure, the following side effects are common:  Pain or discomfort at the IV site.  Nausea.  Vomiting.  Sore throat.  Trouble concentrating.  Feeling cold or chills.  Weak or tired.  Sleepiness and fatigue.  Soreness and body aches. These side effects can affect parts of the body that were not involved in surgery. Follow these instructions at home:  For at least 24 hours after the procedure:  Have a responsible adult stay with you. It is important to have someone help care for you until you are awake and alert.  Rest as needed.  Do not: ? Participate in activities in which you could fall or become injured. ? Drive. ? Use heavy machinery. ? Drink alcohol. ? Take sleeping pills or medicines that cause drowsiness. ? Make important decisions or sign legal documents. ? Take care of children on your own.  Eating and  drinking  Follow any instructions from your health care provider about eating or drinking restrictions.  When you feel hungry, start by eating small amounts of foods that are soft and easy to digest (bland), such as toast. Gradually return to your regular diet.  Drink enough fluid to keep your urine pale yellow.  If you vomit, rehydrate by drinking water, juice, or clear broth. General instructions  If you have sleep apnea, surgery and certain medicines can increase your risk for breathing problems. Follow instructions from your health care provider about wearing your sleep device: ? Anytime you are sleeping, including during daytime naps. ? While taking prescription pain medicines, sleeping medicines, or medicines that make you drowsy.  Return to your normal activities as told by your health care provider. Ask your health care provider what activities are safe for you.  Take over-the-counter and prescription medicines only as told by your health care provider.  If you smoke, do not smoke without supervision.  Keep all follow-up visits as told by your health care provider. This is important. Contact a health care provider if:  You have nausea or vomiting that does not get better with medicine.  You cannot eat or drink without vomiting.  You have pain that does not get better with medicine.  You are unable to pass urine.  You develop a skin rash.  You have a fever.  You have redness around your IV site that gets worse. Get help right away if:  You have difficulty breathing.  You have chest pain.  You have blood in your urine or stool, or you vomit blood. Summary  After the procedure, it is common to have a sore throat or nausea. It is also common to feel tired.  Have a responsible adult stay with you for the first 24 hours after general anesthesia. It is important to have someone help care for you until you are awake and alert.  When you feel hungry, start by eating  small amounts of foods that are soft and easy to digest (bland), such as toast. Gradually return to your regular diet.  Drink enough fluid to keep your urine pale yellow.  Return to your normal activities as told by your health care provider. Ask your health care provider what activities are safe for you. This information is not intended to replace advice given to you by your health care provider. Make sure you discuss any questions you have with your health care provider. Document Revised: 05/13/2017 Document Reviewed: 12/24/2016 Elsevier Patient Education  [place].

## 2019-08-28 NOTE — Anesthesia Postprocedure Evaluation (Signed)
Anesthesia Post Note  Patient: ABEEHA BOLLIN  Procedure(s) Performed: XI ROBOTIC ASSISTED LAPAROSCOPIC HYSTERECTOMY AND SALPINGECTOMY GRATER THAN 250 GRAMS, MINI LAPAROTOMY (Bilateral )     Patient location during evaluation: PACU Anesthesia Type: General Level of consciousness: awake and alert and oriented Pain management: pain level controlled Vital Signs Assessment: post-procedure vital signs reviewed and stable Respiratory status: spontaneous breathing, nonlabored ventilation, respiratory function stable and patient connected to nasal cannula oxygen Cardiovascular status: blood pressure returned to baseline and stable Postop Assessment: no apparent nausea or vomiting Anesthetic complications: no    Last Vitals:  Vitals:   08/28/19 1415 08/28/19 1430  BP: (!) 168/85 (!) 169/88  Pulse: 86 82  Resp: (!) 21 14  Temp:    SpO2: 98% 92%    Last Pain:  Vitals:   08/28/19 1500  TempSrc:   PainSc: 7                  Ximenna Fonseca A.

## 2019-08-28 NOTE — Op Note (Addendum)
OPERATIVE NOTE 08/28/19  Surgeon: Donaciano Eva   Assistants: Dr Lahoma Crocker (an MD assistant was necessary for tissue manipulation, management of robotic instrumentation, retraction and positioning due to the complexity of the case and hospital policies).   Anesthesia: General endotracheal anesthesia  ASA Class: 3   Pre-operative Diagnosis: fibroids and CIN 3  Post-operative Diagnosis: same  Operation: Robotic-assisted laparoscopic total hysterectomy with bilateral salpingoophorectomy   Surgeon: Donaciano Eva  Assistant Surgeon: Lahoma Crocker MD  Anesthesia: GET  Urine Output: 100cc  Operative Findings:  : surgically interrupted fallopian tubes, enlarged left external iliac lymph node, 16cm fibroid uterus with dominant fundal fibroid. Normal abdomen and pelvic survey otherwise including normal appearing ovaries.   Estimated Blood Loss:  20cc      Total IV Fluids: 800 ml         Specimens: right and left fallopian tubes, left external iliac lymph node, uterus with cervix         Complications:  None; patient tolerated the procedure well.         Disposition: PACU - hemodynamically stable.  Procedure Details  The patient was seen in the Holding Room. The risks, benefits, complications, treatment options, and expected outcomes were discussed with the patient.  The patient concurred with the proposed plan, giving informed consent.  The site of surgery properly noted/marked. The patient was identified as Roderic Palau and the procedure verified as a Robotic-assisted hysterectomy with bilateral salpingectomy. A Time Out was held and the above information confirmed.  After induction of anesthesia, the patient was draped and prepped in the usual sterile manner. Pt was placed in supine position after anesthesia and draped and prepped in the usual sterile manner. The abdominal drape was placed after the CholoraPrep had been allowed to dry for 3 minutes.  Her  arms were tucked to her side with all appropriate precautions.  The shoulders were stabilized with padded shoulder blocks applied to the acromium processes.  The patient was placed in the semi-lithotomy position in [place].  The perineum was prepped with Betadine. The patient was then prepped. Foley catheter was placed.  A sterile speculum was placed in the vagina.  The cervix was grasped with a single-tooth tenaculum and dilated with Kennon Rounds dilators.  The ZUMI uterine manipulator with a medium colpotomizer ring was placed without difficulty.  A pneum occluder balloon was placed over the manipulator.  OG tube placement was confirmed and to suction.   Next, a 5 mm skin incision was made 1 cm below the subcostal margin in the midclavicular line.  The 5 mm Optiview port and scope was used for direct entry.  Opening pressure was under 10 mm CO2.  The abdomen was insufflated and the findings were noted as above.   At this point and all points during the procedure, the patient's intra-abdominal pressure did not exceed 15 mmHg. Next, a 10 mm skin incision was made immediately inferior to the umbilicus and a right and left port was placed about 10 cm lateral to the robot port on the right and left side.  A fourth arm was placed in the left lower quadrant 2 cm above and superior and medial to the anterior superior iliac spine.  This was necessary in order to mobilize the large fundal fibroid from the surgical field. All ports were placed under direct visualization.  The patient was placed in steep Trendelenburg.  Bowel was folded away into the upper abdomen.  The robot was docked in  the normal manner.  The hysterectomy was started after the round ligament on the right side was incised and the retroperitoneum was entered and the pararectal space was developed.  The ureter was noted to be on the medial leaf of the broad ligament.  The para-rectal space was opened and developed to the origin of the uterine artery  which was then skeletonized and mobilized off of the ureter and fulgarated with bipolar energy. The peritoneum above the ureter was incised and stretched and the utero-ovarian ligament was skeletonized, cauterized and cut.  The fallopian tube was dissected from the ovary and sent for permanent pathology.The posterior peritoneum was taken down to the level of the KOH ring.  The anterior peritoneum was also taken down.  The bladder flap was created to the level of the KOH ring.  The uterine artery on the right side was skeletonized, cauterized and cut at the level of the uterine isthmus.  A similar procedure was performed on the left.  While operating in the left retroperitoneum to isolated the uterine artery at its origin to control pulse pressure to the uterus, the left retroperitoneum was explored. A bulky 2cm left external iliac lymph node was identified. Due to its concerning size and its prominence in the retroperitoneum (and its interference with proceeding with the dissection, the node was dissected from the external iliac artery by opening the space between the node and the artery and vein, and carefully using monopolar and bipolar energy to separate the node from its attachments to the vessels. The paravesical and pararectal spaces were widely opened to facilitate this dissection and to mobilize the ureter medially off of the vessels and node.     The colpotomy was made and the uterus, cervix, bilateral ovaries and tubes were amputated and placed in an endocatch bag for abdominal retrieval.  Pedicles were inspected and excellent hemostasis was achieved.    The colpotomy at the vaginal cuff was closed with Vicryl on a CT1 needle in a running manner.  Irrigation was used and excellent hemostasis was achieved.  At this point in the procedure was completed.  Robotic instruments were removed under direct visulaization.  The robot was undocked.   A 4cm suprapubic low transverse incision was made with the  scalpel. The subcutaneous skin was opened with the bovie. The fascia was opened with the bovie transversely and the rectus muscles were dissected off of the fascia inferiorally and superiorally. The peritoneum was opened sharply in the midline. The peritoneal incision was extended. The uterine specimen in the endocatch bag was retrieved through the incision. The fascia was closed with running 0-vicryl. The subcutaneous fat was closed with 2-0 vicryl. 20cc of exparel mixed with 20cc of marcaine was infiltrated into the incision. The incision was closed at the skin with monocryl and dermabond.   The 10 mm ports were closed with Vicryl on a UR-5 needle and the fascia was closed with 0 Vicryl on a UR-5 needle.  The skin was closed with 4-0 Vicryl in a subcuticular manner.  Dermabond was applied.  Sponge, lap and needle counts correct x 2.  The patient was taken to the recovery room in stable condition.  The vagina was swabbed with  minimal bleeding noted.   All instrument and needle counts were correct x  3.   The patient was transferred to the recovery room in a stable condition.  Donaciano Eva, MD

## 2019-08-29 ENCOUNTER — Telehealth: Payer: Self-pay

## 2019-08-29 ENCOUNTER — Encounter: Payer: Self-pay | Admitting: *Deleted

## 2019-08-29 NOTE — Telephone Encounter (Signed)
Hi Ebony Holland! I wanted to let you know that we are waiting on this patient's employer to send an [place] form in regadrs to her surgery on 4/6. Melissa has filled out for her cone biopsy she had done in December. I left an orange sticky note on the old paperwork which has the phone number of the FMLA person from [employer] phone number on it

## 2019-08-29 NOTE — Telephone Encounter (Signed)
LM for Ms Ebony Holland to send fax to alt. Number [phone] or [phone]. Form not received as of 08-29-19. Difficultly with fax machine yesterday. Call office [phone] if any questions

## 2019-08-29 NOTE — Telephone Encounter (Signed)
Ms Utterback states that she is drinking and urinating well. Throat sore and is not able to eat as much as she would like. Suggested gargling with salt water to help with the irritation from the intubation tube. She is not passing gas. She will increase the senokot-S to 2 tabs bid today. If no BM by tomorrow, instructed to add Miralax 1 capful bid. Afebrile. Incisions are D&I. Instructed Ms Welford to remove the honeycomb dressing in 5-7 days post op = 4-12. Pain level is 7/10 with Ibuprofen alone. Using the Oxycodone 5 mg helps to bring down pain to 2/10. Instructed pt. to try adding tylenol 500 mg at the 4 hour mark of taking the ibuprofen.  She can also try 1/2 tab of oxycodone at a time as well. Pt is up and moving around the house. Gave her her post op appointment for 09-20-19 at 1 pm. She is aware of the office number [phone] to call if she has any questions or concerns.

## 2019-08-30 ENCOUNTER — Telehealth: Payer: Self-pay | Admitting: *Deleted

## 2019-08-30 ENCOUNTER — Other Ambulatory Visit: Payer: Self-pay | Admitting: Gynecologic Oncology

## 2019-08-30 DIAGNOSIS — G8918 Other acute postprocedural pain: Secondary | ICD-10-CM

## 2019-08-30 LAB — SURGICAL PATHOLOGY

## 2019-08-30 MED ORDER — TRAMADOL HCL 50 MG PO TABS: 50.0000 mg | ORAL_TABLET | Freq: Four times a day (QID) | ORAL | 0 refills | Status: AC | PRN

## 2019-08-30 NOTE — Telephone Encounter (Signed)
Told Ms Service that Dr. Denman George will send in tramadol  #10 tabs to her pharmacy for pain. She needs to continue with ibuprofen alt. with tylenol. She can use heat or ice on abdomen to help with pain. If this is not effective for pain control, she needs to call the office.  Dr. Denman George will order a CT scan of A/P to further evaluate her pain. Pt verbalized understanding.

## 2019-08-30 NOTE — Telephone Encounter (Addendum)
Patient called and stated "I had surgery with Dr Denman George on Tuesday. (RALTH,BSO, mimi lap) I started with just the ibuprofen and that didn't help.  So I started the oxy it helps some but only last 2 hrs. I try the tylenol also, but that didn't help. I am getting up to walk, but the pain is worse with movement. The pain is in the stomach area with the honeycone dressing. My pain level is a 7. I wanted to know if I can get some different type on pain medicine? I know the day of surgery they had to change the medicine because the first pain medicine wasn't helping. Could I get whatever the second one was, it helped.I have 3 of the oxy left." Explained that I would give the message to [facility] and we would call her back.

## 2019-08-30 NOTE — Progress Notes (Signed)
See RN note.

## 2019-08-31 ENCOUNTER — Telehealth: Payer: Self-pay

## 2019-08-31 NOTE — Telephone Encounter (Signed)
Daughter calling stating that her mother woke up ~Midnight and began vomiting.  She vomited ~6-8 times since Midnight. She did have a formed stool on 08-29-19. She has had loose e stool since and diarrhea today. Pt was using the senokot-s bid on 4- then 2 tabs at hs last night. Pt was eating and drinking well yesterday. Pain in abdomen currently  is a 4/10. Ms Njoroge states that the pain in is improving. Ms Roel states that she does not do well with oral medications.  She would like a scopolamine patch sent in for N/V with a refill as it is only good for 3 days. Will discuss with Melissa Cross,NP and call the daughter back with recommendations. Pt instructed to take in clear liquids only for now. Daughter to get Pedialyte and or G2 for mother as she is diabetic.

## 2019-08-31 NOTE — Telephone Encounter (Signed)
Patient's daughter Hal Hope called to report that her mother has been having nausea and vomiting since yesterday but has stopped so far this morning.  Daughter is requesting a "patch" to put behind patient's ear like she had gotten in the hospital.  Mother is not tolerating pain very well.  New pain was prescribed yesterday.  Will send info to NP for advice.

## 2019-08-31 NOTE — Telephone Encounter (Signed)
Followed up with daughter around 57. Ms Agar' sister and other relatives were there. Some one had just given her her insulin. She takes 20 units of latus long acting insulin in the evening.  Stated concern that her blood sugar had not been checked prior as she has not eaten or drunk much today.  Family obtained blood sugar =245.  Family stated that Ms Sugawara has not taken her insulin since surgery. She was under the impression that she was not to take it. Reviewed with Dr. Berline Lopes.   Told pt.'s sister that Dr. Berline Lopes said she needs to take insulin and check her blood sugar bid.  She needs to drink fluids.  Pt drank 12 oz of water while nuse on phone.  Sister got a tylenol 500 mg tab in ~1 pm. Told sis ter that she needs to get in a Ibuprofen 800 mg in and then alt. with Tylenol. Pain level 5/10. No pain medication taken between 8 pm last night at 1 pm today.  Pain is decreasing.  She has kept water down.   Not taking her insulin/ elevated blood sugar may be contributing factor to he N/V. Pt to increase diet( Diabetic diet) as tolerated and at least take in 64+ oz of fluid a day. Family knows the number to call the office after hours over the weekend if any further questions or concerns arise. [phone].

## 2019-08-31 NOTE — Telephone Encounter (Signed)
Called Candace to see how her mother was doing. She said that her mother has refused to eat or drink anything. Candace things she is afraid to vomit.Candance is out getting some Nabs for her mother.  She said that she would eat this.  Told her that this nurse would follow up in an hour.

## 2019-08-31 NOTE — Telephone Encounter (Signed)
Told daughter Ebony Holland that Dr.Tucker said that Ebony Holland needs to stop the narcotic medications. Use the Ibuprofen alt with Tylenol 500 mg. She needs to drink 8 oz of fluid every ~90 minutes to not get dehydrated. She can eat jello, soups, crackers.   She needs to take the 800 mg of Ibuprofen in the next hour as she has not had any pain medication since 8 pm last evening.  Then take 500 mg tylenol 4 hours after the ibuprofen not to exceed 4000 mg in a 24 hr period of time. If she continues to vomit, she will need to be seen this afternoon in the clinic. Will f/u with patient's condition this afternoon.

## 2019-09-03 ENCOUNTER — Telehealth: Payer: Self-pay

## 2019-09-03 NOTE — Telephone Encounter (Signed)
Ebony Holland states that she is doing well. She is drinking fluids well. Progressing diet today. She is not in pain. Told her that she can Use ibuprofen Tylenol prn or use less frequently then q 8 hrs if she feels she can progress to this. Pt verbalized understanding and appreciated the phone call.

## 2019-09-20 ENCOUNTER — Inpatient Hospital Stay: Payer: Commercial Managed Care - PPO | Attending: Gynecologic Oncology | Admitting: Gynecologic Oncology

## 2019-09-20 ENCOUNTER — Encounter: Payer: Self-pay | Admitting: Gynecologic Oncology

## 2019-09-20 ENCOUNTER — Other Ambulatory Visit: Payer: Self-pay

## 2019-09-20 VITALS — BP 125/80 | HR 105 | Temp 98.5°F | Resp 16 | Ht 64.0 in | Wt 145.1 lb

## 2019-09-20 DIAGNOSIS — Z90722 Acquired absence of ovaries, bilateral: Secondary | ICD-10-CM

## 2019-09-20 DIAGNOSIS — D259 Leiomyoma of uterus, unspecified: Secondary | ICD-10-CM | POA: Diagnosis not present

## 2019-09-20 DIAGNOSIS — Z90710 Acquired absence of both cervix and uterus: Secondary | ICD-10-CM | POA: Insufficient documentation

## 2019-09-20 DIAGNOSIS — D069 Carcinoma in situ of cervix, unspecified: Secondary | ICD-10-CM | POA: Insufficient documentation

## 2019-09-20 NOTE — Patient Instructions (Signed)
Please avoid intercourse for an additional 4 weeks.  It is safe to return to work 6 weeks after surgery (May 19th).   You may resume tub baths and swimming.  You will have no lifting restrictions after May 4th.   You remain at risk for the development of vaginal cancer, therefore it is important that you follow-up with Dr Wilhelmenia Blase annually for a pap smear.

## 2019-09-20 NOTE — Progress Notes (Signed)
Follow-up Note: Gyn-Onc  Consult was initially requested by Dr. Wilhelmenia Blase for the evaluation of Ebony Holland 54 y.o. female  CC:  Chief Complaint  Patient presents with  . Carcinoma in situ of cervix, unspecified location    post op  . Fibroids    Assessment/Plan:  Ebony Holland  is a 54 y.o.  year old with CIN III and symptomatic uterine fibroids s/p total robotic hysterectomy for a uterus >250gm, with bilateral salpginectomy and minilaparotomy on 08/28/19.  She has done well post-op. I see no reason why she wouldn't be able return to full work with no restrictions at 6 weeks postop.  We discussed her ongoing needs for surveillance for vaginal paps with Dr Wilhelmenia Blase due to the potential risk for recurrence in the vagina of her dysplasia and possible vaginal cancer.  HPI: Ebony Holland is a 54 year old P4 who was seen in consultation at the request of [place] for evaluation of CIN-3 and symptomatic uterine fibroids.  The patient reported having an abnormal Pap test during her work-up for abnormal uterine bleeding.  This was performed in February 06, 2019 and revealed H SIL with negative high-risk HPV.  Trichomonas was also present.  She reported that 3 years prior to this Pap test she also had an abnormal Pap test that did not require intervention.  Following her H SIL Pap she then proceeded to have a colposcopic evaluation of the cervix on February 15, 2019.  The colposcopic exam was unsatisfactory.  The entire transformation zone was not visualized.  There were acetowhite changes at 11:00.  Biopsies were taken from the ectocervix which revealed benign cervical mucosa and from the endocervix a curettage was performed which showed CIN-2 to 3.  With respect to her fibroid symptoms she reports menorrhagia menorrhagia for approximately 5 to 6 months.  She is never required a blood transfusion.  She reports increasing pelvic pain and intolerance with pelvic  examinations.  Ultrasound dimensions of the uterus on February 15, 2019 revealed a uterus measuring 11.15 x 8.46 x 4.71 cm with an endometrial thickness of 6.8 mm.  The left and right ovaries were all less than 3 cm and normal.  The largest fibroid measured 6.8 cm.  It was subserosal and extending off the fundus.  Otherwise her medical history is remarkable for hypertension and diabetes mellitus.  Her diabetes is type II.  She no longer takes insulin.  She reports that in October 2020 her hemoglobin A1c was 7.1.  She is overweight with a BMI of 25 but no diabetes.  She is a 1 private cesarean section and 3 prior vaginal births but denies other abdominal surgeries and has never had a myomectomy.  On April 02, 2019 she underwent cold knife conization of the cervix. Intraoperative findings were significant for no gross ectocervical disease. Surgery was uncomplicated.  Final pathology revealed CIN-3 with extensive involvement of endocervical glands.  The margins of the specimen were negative for high-grade dysplasia including the post cone ECC.  However CIN-1 involve the ectocervical margin.  Since surgery she did well healing from her current.  She developed pyelonephritis.  Her blood glucose was poorly controlled with a hemoglobin A1c of 9.2% in October 2020.  She had a CT scan of the abdomen pelvis which confirmed pyelonephritis and a bulky fibroid uterus.  She was started on an insulin regimen and has much better blood glucose control.   Interval Hx:  On 08/28/19 she underwent a robotic assisted total hysterectomy for  a uterus >250gm, bilateral salpingectomy, minilap for specimen delivery. Intraoperative findings were significant for a 16cm fibroid uterus with a dominant fundal fibroid. Normal ovaries. There was an enlarged left external iliac node that was removed. Surgery was uncomplicated.  Final pathology revealed CIN 1 in her cervix, benign fibroids in the uterus, and a benign left external  iliac lymph node.  Since surgery she initially had severe pain, though this has completely resolved.   Current Meds:  Outpatient Encounter Medications as of 09/20/2019  Medication Sig  . cholecalciferol (VITAMIN D3) 25 MCG (1000 UT) tablet Take 1,000 Units by mouth daily.  Marland Kitchen glipiZIDE (GLUCOTROL) 10 MG tablet Take 10 mg by mouth 2 (two) times daily.  Marland Kitchen ibuprofen (ADVIL) 800 MG tablet Take 1 tablet (800 mg total) by mouth every 8 (eight) hours as needed for moderate pain. For AFTER surgery  . LANTUS SOLOSTAR 100 UNIT/ML Solostar Pen Inject 20-30 Units into the skin daily. 20 units unless BS is over 150 then use 30 units  . lisinopril (ZESTRIL) 40 MG tablet Take 40 mg by mouth daily.  . Multiple Vitamin (MULTIVITAMIN) capsule Take 1 capsule by mouth daily.  . traMADol (ULTRAM) 50 MG tablet Take 1 tablet (50 mg total) by mouth every 6 (six) hours as needed for severe pain. Do not take and drive, do not take with other pain meds  . oxyCODONE (OXY IR/ROXICODONE) 5 MG immediate release tablet Take 1 tablet (5 mg total) by mouth every 4 (four) hours as needed for severe pain. For AFTER surgery only, do not take and drive (Patient not taking: Reported on 09/20/2019)  . senna-docusate (SENOKOT-S) 8.6-50 MG tablet Take 2 tablets by mouth at bedtime. For AFTER surgery, do not take if having diarrhea (Patient not taking: Reported on 09/20/2019)   No facility-administered encounter medications on file as of 09/20/2019.    Allergy: No Known Allergies  Social Hx:   Social History   Socioeconomic History  . Marital status: Legally Separated    Spouse name: Not on file  . Number of children: Not on file  . Years of education: Not on file  . Highest education level: Not on file  Occupational History  . Not on file  Tobacco Use  . Smoking status: Never Smoker  . Smokeless tobacco: Never Used  Substance and Sexual Activity  . Alcohol use: Not Currently  . Drug use: Never  . Sexual activity: Not on  file  Other Topics Concern  . Not on file  Social History Narrative  . Not on file   Social Determinants of Health   Financial Resource Strain:   . Difficulty of Paying Living Expenses:   Food Insecurity:   . Worried About Charity fundraiser in the Last Year:   . Arboriculturist in the Last Year:   Transportation Needs:   . Film/video editor (Medical):   Marland Kitchen Lack of Transportation (Non-Medical):   Physical Activity:   . Days of Exercise per Week:   . Minutes of Exercise per Session:   Stress:   . Feeling of Stress :   Social Connections:   . Frequency of Communication with Friends and Family:   . Frequency of Social Gatherings with Friends and Family:   . Attends Religious Services:   . Active Member of Clubs or Organizations:   . Attends Archivist Meetings:   Marland Kitchen Marital Status:   Intimate Partner Violence:   . Fear of Current or Ex-Partner:   .  Emotionally Abused:   Marland Kitchen Physically Abused:   . Sexually Abused:     Past Surgical Hx:  Past Surgical History:  Procedure Laterality Date  . CERVICAL CONIZATION W/BX N/A 04/02/2019   Procedure: CONIZATION CERVIX WITH BIOPSY;  Surgeon: Everitt Amber, MD;  Location: [facility];  Service: Gynecology;  Laterality: N/A;  . CESAREAN SECTION N/A 05/25/1991  . ROBOTIC ASSISTED LAPAROSCOPIC HYSTERECTOMY AND SALPINGECTOMY Bilateral 08/28/2019   Procedure: XI ROBOTIC ASSISTED LAPAROSCOPIC HYSTERECTOMY AND SALPINGECTOMY GRATER THAN 250 GRAMS, MINI LAPAROTOMY;  Surgeon: Everitt Amber, MD;  Location: [facility];  Service: Gynecology;  Laterality: Bilateral;  . TUBAL LIGATION Bilateral 1995    Past Medical Hx:  Past Medical History:  Diagnosis Date  . Breast lump    left  . CIN III (cervical intraepithelial neoplasia III)   . History of pyelonephritis    03-23-2019  . Hypertension    followed by pcp  (03-30-2019  per pt never had a stress test)  . Mixed dyslipidemia   . Type 2 diabetes mellitus [facility])     endocrinologist--- dr doer ([facility] in [facility])----  checks cbg in morning,  fasting cbg-- 153  . Uterine fibroid   . Wears glasses     Past Gynecological History:  No bleeding No LMP recorded.  Family Hx:  Family History  Problem Relation Age of Onset  . Breast cancer Mother     Review of Systems:  Constitutional  Feels well,    ENT Normal appearing ears and nares bilaterally Skin/Breast  No rash, sores, jaundice, itching, dryness Cardiovascular  No chest pain, shortness of breath, or edema  Pulmonary  No cough or wheeze.  Gastro Intestinal  No nausea, vomitting, or diarrhoea. No bright red blood per rectum, no abdominal pain, change in bowel movement, or constipation.  Genito Urinary  No frequency, urgency, dysuria, + abnormal vaginal bleeding, + pelvic pains Musculo Skeletal  No myalgia, arthralgia, joint swelling or pain  Neurologic  No weakness, numbness, change in gait,  Psychology  No depression, anxiety, insomnia.   Vitals:  Blood pressure 125/80, pulse (!) 105, temperature 98.5 F (36.9 C), temperature source Temporal, resp. rate 16, height 5\' 4"  (1.626 m), weight 145 lb 2 oz (65.8 kg), SpO2 100 %.  Physical Exam: WD in NAD Neck  Supple NROM, without any enlargements.  Lymph Node Survey No cervical supraclavicular or inguinal adenopathy Cardiovascular  Pulse normal rate, regularity and rhythm. S1 and S2 normal.  Lungs  Clear to auscultation bilateraly, without wheezes/crackles/rhonchi. Good air movement.  Skin  No rash/lesions/breakdown  Psychiatry  Alert and oriented to person, place, and time  Abdomen  Normoactive bowel sounds, abdomen soft, non-tender and nonobese without evidence of hernia. Well healed incisions, no hematoma/drainage/fluctuance.  Back No CVA tenderness Genito Urinary  Vaginal cuff intact, suture material present.  Rectal  deferred Extremities  No bilateral cyanosis, clubbing or edema.   Thereasa Solo, MD  09/20/2019, 1:37  PM

## 2019-10-05 ENCOUNTER — Telehealth: Payer: Self-pay | Admitting: *Deleted

## 2019-10-05 NOTE — Telephone Encounter (Signed)
Faxed FMLA

## 2019-12-15 IMAGING — CT CT ABD-PELV W/ CM
2 of 5 series · 15 of 46 positions shown, 17 images · IV contrast (omnipaque)
Comparison: None.

CLINICAL DATA: Abdominal pain for a week.

EXAM:
CT ABDOMEN AND PELVIS WITH CONTRAST
TECHNIQUE: Multidetector CT imaging of the abdomen and pelvis was performed
using the standard protocol following bolus administration of
intravenous contrast.
CONTRAST:  100mL OMNIPAQUE IOHEXOL 300 MG/ML  SOLN

[Series 2: axial st · axial · 0.63mm/px · z∈[-443,-58]mm · 12 of 89 slices shown, 14 images]
[im 6/89  soft-tissue]
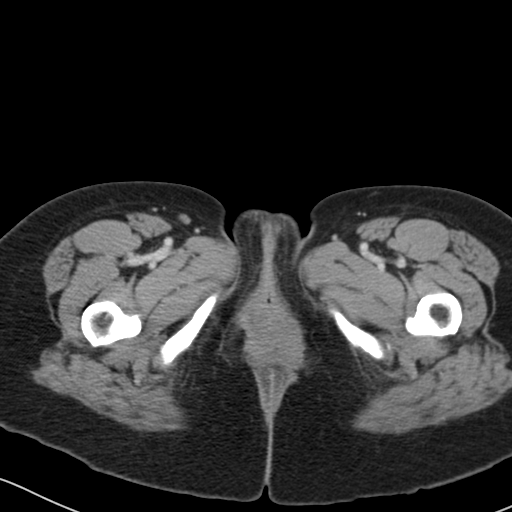
[im 6/89  bone]
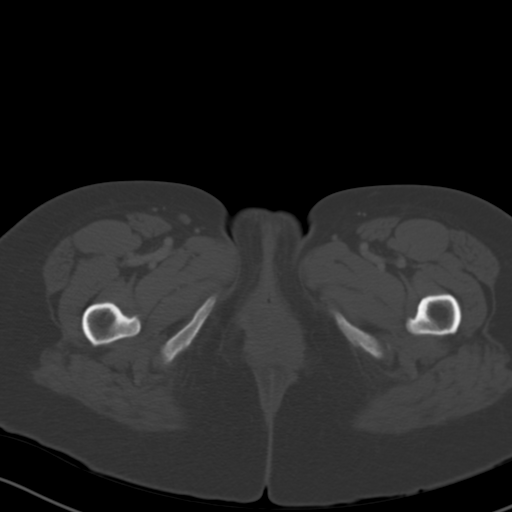
[im 12/89  soft-tissue]
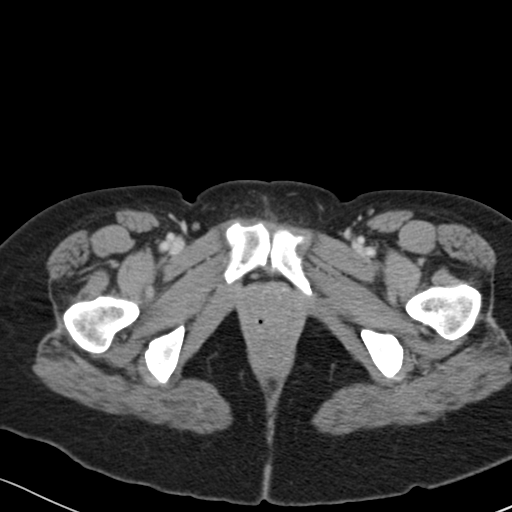
[im 18/89  soft-tissue]
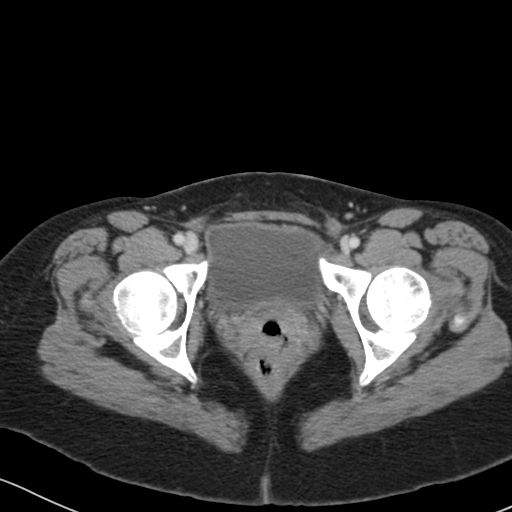
[im 30/89  soft-tissue]
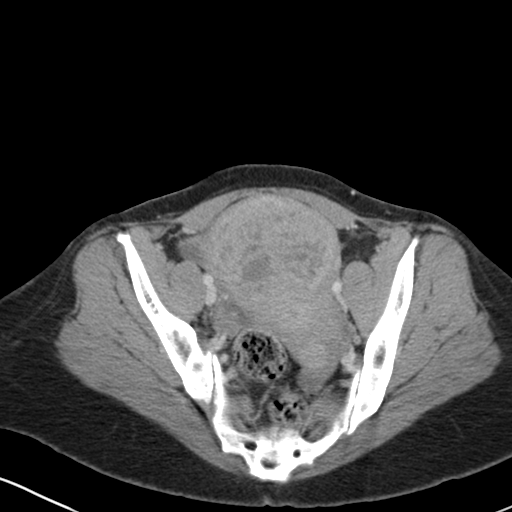
[im 36/89  soft-tissue]
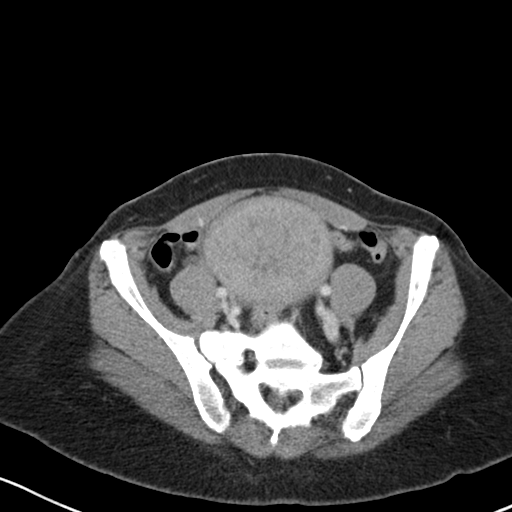
[im 42/89  soft-tissue]
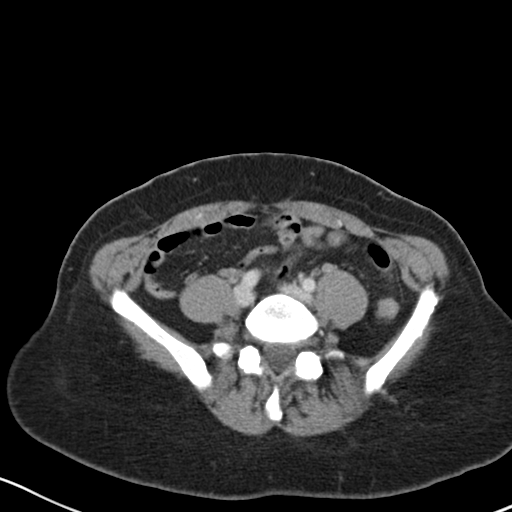
[im 47/89  soft-tissue]
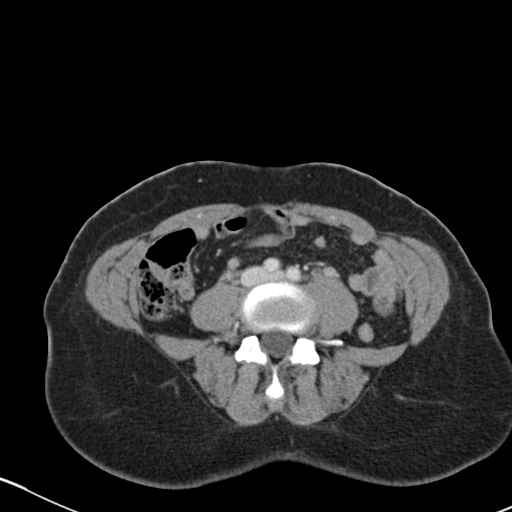
[im 53/89  soft-tissue]
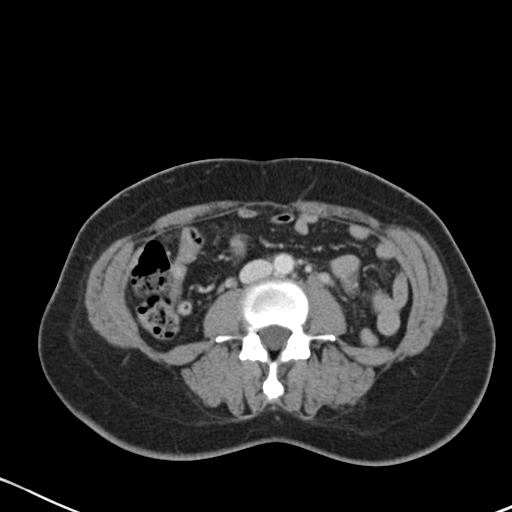
[im 59/89  soft-tissue]
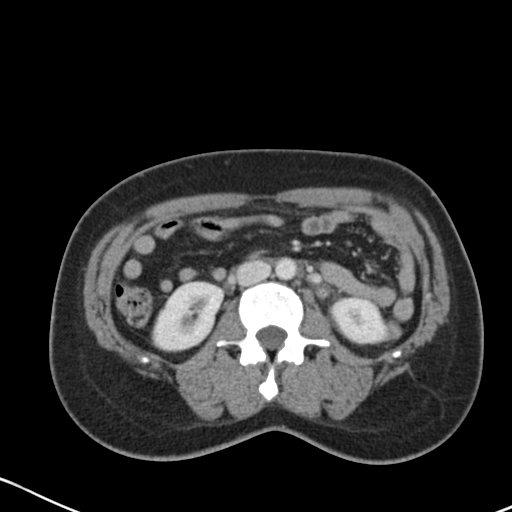
[im 59/89  bone]
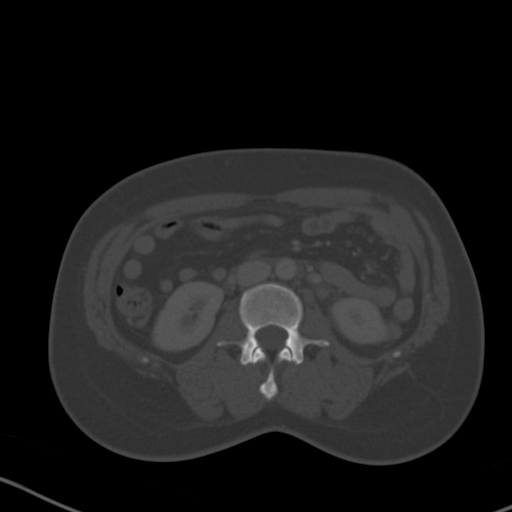
[im 71/89  soft-tissue]
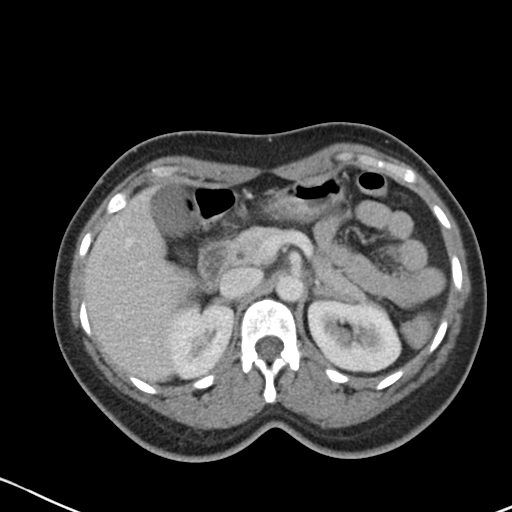
[im 77/89  soft-tissue]
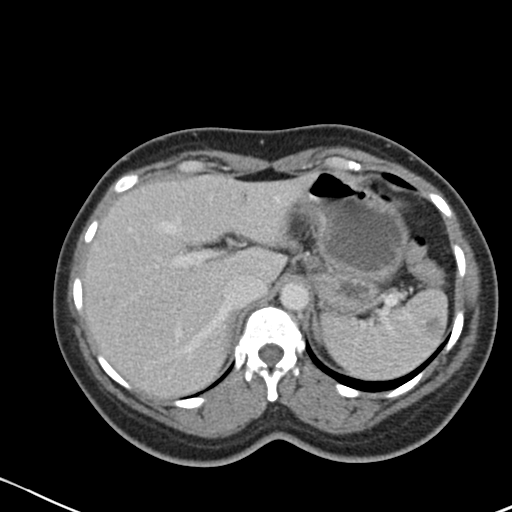
[im 83/89  soft-tissue]
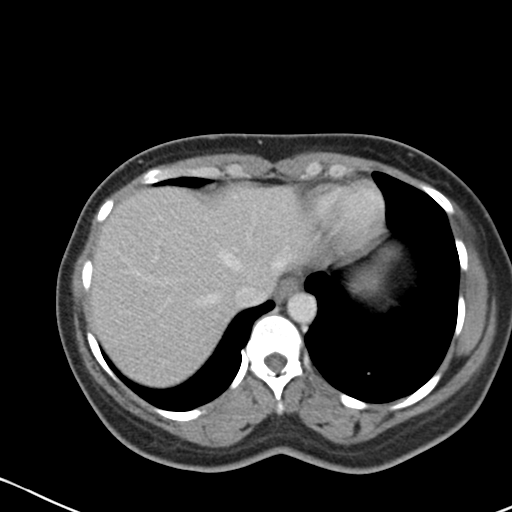

[Series 5: coronal st · coronal · 0.64mm/px · 3 of 117 slices shown]
[im 39/117  soft-tissue]
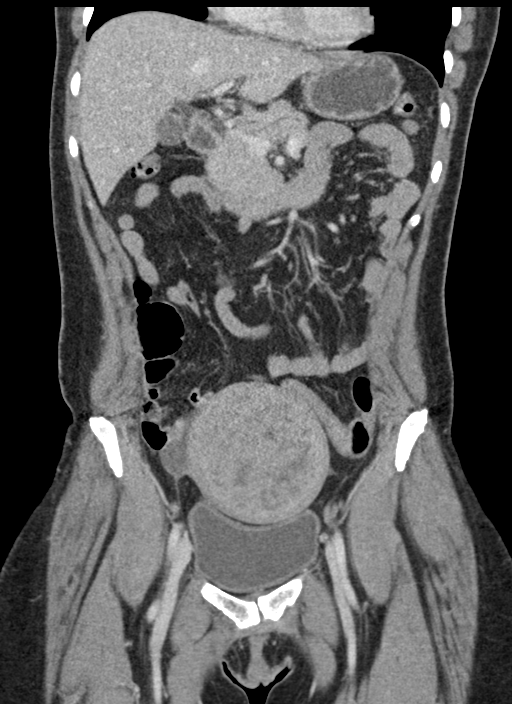
[im 52/117  soft-tissue]
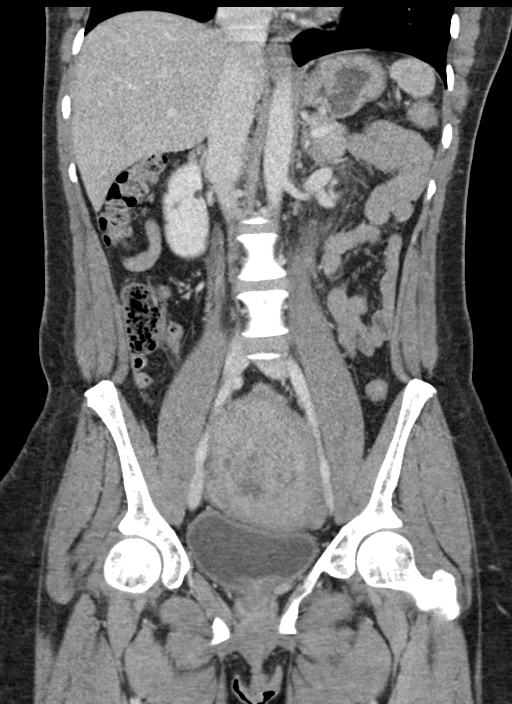
[im 65/117  soft-tissue]
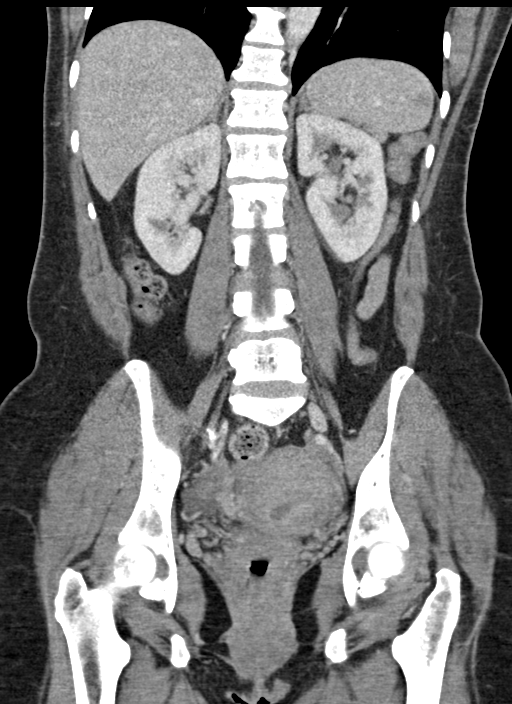

[15 of 46 positions shown; findings below may reference images not displayed]

FINDINGS: Lower chest: No acute abnormality.

Hepatobiliary: No focal liver abnormality is seen. No gallstones,
gallbladder wall thickening, or biliary dilatation.

Pancreas: Unremarkable. No pancreatic ductal dilatation or
surrounding inflammatory changes.

Spleen: Low-attenuation regions in the spleen are nonspecific but
may represent cysts or hemangiomas.

Adrenals/Urinary Tract: Adrenal glands are normal. There is
urothelial enhancement in the left renal pelvis and proximal left
ureter. There is a small amount of adjacent fat stranding and a
small amount of perinephric fluid in this region. The remainder of
the left ureter is normal with no stones. No evidence of
pyelonephritis. The kidneys are otherwise normal. The right ureter
is normal. The bladder is unremarkable. Adrenal glands are normal.

Stomach/Bowel: The stomach and small bowel are unremarkable. The
colon and appendix are normal.

Vascular/Lymphatic: No significant vascular findings are present. No
enlarged abdominal or pelvic lymph nodes.

Reproductive: There is a large fibroid in the uterus.

Other: No abdominal wall hernia or abnormality. No abdominopelvic
ascites.

Musculoskeletal: There is a sclerotic lesion within L2 which is
ill-defined and nonspecific. A sclerotic focus is seen at L1 as
well.
IMPRESSION: 1. There is urothelial enhancement in the left renal pelvis and
proximal ureter with a small amount of adjacent fat stranding and
perinephric fluid. These findings are likely infectious. No
associated parenchymal changes are seen in the left kidney.
Recommend correlation with history, physical exam, and urinalysis.
2. Sclerotic lesion in L2. Sclerotic focus at L1. These findings are
nonspecific. If the patient has outside imaging, recommend
comparison. Otherwise, recommend a bone scan for further evaluation.
3. Large fibroid in the uterus.
4. Low-attenuation lesions in the spleen are nonspecific but of
doubtful acute significance.
